# Patient Record
Sex: Female | Born: 1985 | Hispanic: Yes | Marital: Single | Smoking: Never smoker
Health system: Southern US, Community
[De-identification: ages and names within clinical notes are randomized; demographics above are authoritative.]

## PROBLEM LIST (undated history)

## (undated) DIAGNOSIS — N83209 Unspecified ovarian cyst, unspecified side: Secondary | ICD-10-CM

## (undated) DIAGNOSIS — J45909 Unspecified asthma, uncomplicated: Secondary | ICD-10-CM

## (undated) DIAGNOSIS — D649 Anemia, unspecified: Secondary | ICD-10-CM

## (undated) DIAGNOSIS — T7840XA Allergy, unspecified, initial encounter: Secondary | ICD-10-CM

## (undated) HISTORY — PX: TUBAL LIGATION: SHX77

## (undated) HISTORY — DX: Allergy, unspecified, initial encounter: T78.40XA

## (undated) HISTORY — PX: SHOULDER SURGERY: SHX246

---

## 2016-05-07 ENCOUNTER — Encounter (HOSPITAL_COMMUNITY): Payer: Self-pay

## 2016-05-07 ENCOUNTER — Emergency Department (HOSPITAL_COMMUNITY)
Admission: EM | Admit: 2016-05-07 | Discharge: 2016-05-08 | Disposition: A | Discharge status: Home / Self Care | Payer: Medicaid - Out of State | Attending: Emergency Medicine | Admitting: Emergency Medicine

## 2016-05-07 DIAGNOSIS — R200 Anesthesia of skin: Secondary | ICD-10-CM | POA: Insufficient documentation

## 2016-05-07 DIAGNOSIS — I639 Cerebral infarction, unspecified: Secondary | ICD-10-CM | POA: Diagnosis not present

## 2016-05-07 DIAGNOSIS — R42 Dizziness and giddiness: Secondary | ICD-10-CM | POA: Diagnosis present

## 2016-05-07 DIAGNOSIS — R202 Paresthesia of skin: Secondary | ICD-10-CM

## 2016-05-07 LAB — CBC
HEMATOCRIT: 35.8 % — AB (ref 36.0–46.0)
HEMOGLOBIN: 12.5 g/dL (ref 12.0–15.0)
MCH: 29.8 pg (ref 26.0–34.0)
MCHC: 34.9 g/dL (ref 30.0–36.0)
MCV: 85.2 fL (ref 78.0–100.0)
Platelets: 175 10*3/uL (ref 150–400)
RBC: 4.2 MIL/uL (ref 3.87–5.11)
RDW: 12.9 % (ref 11.5–15.5)
WBC: 12 10*3/uL — ABNORMAL HIGH (ref 4.0–10.5)

## 2016-05-07 LAB — URINE MICROSCOPIC-ADD ON: RBC / HPF: NONE SEEN RBC/hpf (ref 0–5)

## 2016-05-07 LAB — URINALYSIS, ROUTINE W REFLEX MICROSCOPIC
Bilirubin Urine: NEGATIVE
Glucose, UA: NEGATIVE mg/dL
HGB URINE DIPSTICK: NEGATIVE
Ketones, ur: NEGATIVE mg/dL
NITRITE: NEGATIVE
PH: 6 (ref 5.0–8.0)
Protein, ur: NEGATIVE mg/dL
SPECIFIC GRAVITY, URINE: 1.013 (ref 1.005–1.030)

## 2016-05-07 LAB — COMPREHENSIVE METABOLIC PANEL
ALBUMIN: 4.6 g/dL (ref 3.5–5.0)
ALK PHOS: 39 U/L (ref 38–126)
ALT: 18 U/L (ref 14–54)
ANION GAP: 8 (ref 5–15)
AST: 22 U/L (ref 15–41)
BUN: 18 mg/dL (ref 6–20)
CALCIUM: 9.2 mg/dL (ref 8.9–10.3)
CO2: 26 mmol/L (ref 22–32)
Chloride: 104 mmol/L (ref 101–111)
Creatinine, Ser: 0.57 mg/dL (ref 0.44–1.00)
GFR calc non Af Amer: >60 mL/min (ref >60)
GLUCOSE: 99 mg/dL (ref 65–99)
POTASSIUM: 3.6 mmol/L (ref 3.5–5.1)
SODIUM: 138 mmol/L (ref 135–145)
Total Bilirubin: 0.4 mg/dL (ref 0.3–1.2)
Total Protein: 7.5 g/dL (ref 6.5–8.1)

## 2016-05-07 LAB — POC URINE PREG, ED: PREG TEST UR: NEGATIVE

## 2016-05-07 LAB — LIPASE, BLOOD: LIPASE: 23 U/L (ref 11–51)

## 2016-05-07 NOTE — ED Triage Notes (Signed)
Pt states that she has been feeling dizzy all day today. Pt also endorsing 2 episodes of emesis and L sided facial numbness. No LOC. No facial droop or other stroke symptoms noted in triage. Ambulatory. A&Ox4.

## 2016-05-07 NOTE — ED Provider Notes (Signed)
[facility] DEPT Provider Note   CSN: [id number] Arrival date & time: 05/07/16  2015   By signing my name below, I, Macon Large, attest that this documentation has been prepared under the direction and in the presence of Rolland Porter, MD. Electronically Signed: Macon Large, ED Scribe. 05/07/16. 12:57 AM.  Time seen 23:54 PM  History   Chief Complaint Chief Complaint  Patient presents with  . Dizziness  . Emesis   The history is provided by the patient. No language interpreter was used.     HPI Comments: Shannon Tucker is a 30 y.o. female who presents to the Emergency Department complaining of moderate, constant dizziness onset today.She denies a feeling of spinning and states she just feels weak like she might pass out. She reports associated generalized weakness. Pt also reports numbness in left hand and facial numbness onset around 4 PM, but notes it lasted for about three hours. She notes having a similar episode of numbness 2-3 months ago but did not f/u with PCP. She states it is happening about once a month. She states it can last all day. She also notes she has vomited twice a day and feels nauseated, she denies any diarrhea. Pt also notes having HA that radiate towards the back of her head and is gradually worsened with movement of her head to the left or right and is not painful when she is looking straight ahead. She denies neck pain. Pt also notes blurred vision. Pt reports no modifying factors noted. Pt states she is not currently on birth control or hormone replacement. She also denies Fhx of strokes. Per pt, she notes having a  PCP in [place], but has not gotten one here due to her recent move to [place] two weeks ago. She is not a current smoker. She does not consume acholic beverages.    She denies weakness, trouble speaking and trouble thinking or saying what she wants to say. No additional complaints at this time.   PCP none  History reviewed. No pertinent past medical  history.  There are no active problems to display for this patient.   History reviewed. No pertinent surgical history.  OB History    No data available       Home Medications    Prior to Admission medications   Medication Sig Start Date End Date Taking? Authorizing Provider  Fructose-Dextrose-Phosphor Acd (NAUSEA CONTROL PO) Take 20 mLs by mouth every 8 (eight) hours as needed (nausea).   Yes Historical Provider, MD    Family History History reviewed. No pertinent family history.  Social History Social History  Substance Use Topics  . Smoking status: Never Smoker  . Smokeless tobacco: Never Used  . Alcohol use Not on file  unemployed Moved here from [place] 2 weeks ago   Allergies   Patient has no known allergies.   Review of Systems Review of Systems  Eyes: Positive for visual disturbance (blurred vision).  Gastrointestinal: Positive for diarrhea and vomiting.  Musculoskeletal: Negative for neck pain.  Neurological: Positive for dizziness, numbness (facial and left hand) and headaches.  All other systems reviewed and are negative.    Physical Exam Updated Vital Signs BP 108/74 (BP Location: Left Arm)   Pulse 70   Temp 97.9 F (36.6 C) (Oral)   Resp 18   Wt 127 lb (57.6 kg)   SpO2 100%   Vital signs normal    Physical Exam  Constitutional: She is oriented to person, place, and time. She appears  well-developed and well-nourished.  Non-toxic appearance. She does not appear ill. No distress.  HENT:  Head: Normocephalic and atraumatic.  Right Ear: External ear normal.  Left Ear: External ear normal.  Nose: Nose normal. No mucosal edema or rhinorrhea.  Mouth/Throat: Oropharynx is clear and moist and mucous membranes are normal. No dental abscesses or uvula swelling.  Eyes: Conjunctivae and EOM are normal. Pupils are equal, round, and reactive to light.  Neck: Normal range of motion and full passive range of motion without pain. Neck supple.  Cardiovascular:  Normal rate, regular rhythm and normal heart sounds.  Exam reveals no gallop and no friction rub.   No murmur heard. Pulmonary/Chest: Effort normal and breath sounds normal. No respiratory distress. She has no wheezes. She has no rhonchi. She has no rales. She exhibits no tenderness and no crepitus.  Abdominal: Soft. Normal appearance and bowel sounds are normal. She exhibits no distension. There is no tenderness. There is no rebound and no guarding.  Musculoskeletal: Normal range of motion. She exhibits no edema or tenderness.  Moves all extremities well.   Neurological: She is alert and oriented to person, place, and time. She has normal strength. No cranial nerve deficit.  Grips are equal. No pronator drift. No lower extremity weakness.   Skin: Skin is warm, dry and intact. No rash noted. No erythema. No pallor.  Psychiatric: She has a normal mood and affect. Her speech is normal and behavior is normal. Her mood appears not anxious.  Nursing note and vitals reviewed.    ED Treatments / Results  Labs (all labs ordered are listed, but only abnormal results are displayed) Results for orders placed or performed during the hospital encounter of 05/07/16  Lipase, blood  Result Value Ref Range   Lipase 23 11 - 51 U/L  Comprehensive metabolic panel  Result Value Ref Range   Sodium 138 135 - 145 mmol/L   Potassium 3.6 3.5 - 5.1 mmol/L   Chloride 104 101 - 111 mmol/L   CO2 26 22 - 32 mmol/L   Glucose, Bld 99 65 - 99 mg/dL   BUN 18 6 - 20 mg/dL   Creatinine, Ser 0.57 0.44 - 1.00 mg/dL   Calcium 9.2 8.9 - 10.3 mg/dL   Total Protein 7.5 6.5 - 8.1 g/dL   Albumin 4.6 3.5 - 5.0 g/dL   AST 22 15 - 41 U/L   ALT 18 14 - 54 U/L   Alkaline Phosphatase 39 38 - 126 U/L   Total Bilirubin 0.4 0.3 - 1.2 mg/dL   GFR calc non Af Amer >60 >60 mL/min   GFR calc Af Amer >60 >60 mL/min   Anion gap 8 5 - 15  CBC  Result Value Ref Range   WBC 12.0 (H) 4.0 - 10.5 K/uL   RBC 4.20 3.87 - 5.11 MIL/uL    Hemoglobin 12.5 12.0 - 15.0 g/dL   HCT 35.8 (L) 36.0 - 46.0 %   MCV 85.2 78.0 - 100.0 fL   MCH 29.8 26.0 - 34.0 pg   MCHC 34.9 30.0 - 36.0 g/dL   RDW 12.9 11.5 - 15.5 %   Platelets 175 150 - 400 K/uL  Urinalysis, Routine w reflex microscopic  Result Value Ref Range   Color, Urine YELLOW YELLOW   APPearance CLOUDY (A) CLEAR   Specific Gravity, Urine 1.013 1.005 - 1.030   pH 6.0 5.0 - 8.0   Glucose, UA NEGATIVE NEGATIVE mg/dL   Hgb urine dipstick NEGATIVE NEGATIVE  Bilirubin Urine NEGATIVE NEGATIVE   Ketones, ur NEGATIVE NEGATIVE mg/dL   Protein, ur NEGATIVE NEGATIVE mg/dL   Nitrite NEGATIVE NEGATIVE   Leukocytes, UA SMALL (A) NEGATIVE  Urine microscopic-add on  Result Value Ref Range   Squamous Epithelial / LPF 0-5 (A) NONE SEEN   WBC, UA 0-5 0 - 5 WBC/hpf   RBC / HPF NONE SEEN 0 - 5 RBC/hpf   Bacteria, UA RARE (A) NONE SEEN  POC urine preg, ED  Result Value Ref Range   Preg Test, Ur NEGATIVE NEGATIVE   Laboratory interpretation all normal except leukocytosis    EKG  EKG Interpretation None       Radiology Mr [facility] Wo Contrast  Mr Brain Wo Contrast  Result Date: 05/08/2016 CLINICAL DATA:  Dizziness.  Left-sided facial numbness. EXAM: MRI HEAD WITHOUT CONTRAST MRA HEAD WITHOUT CONTRAST TECHNIQUE: Multiplanar, multiecho pulse sequences of the brain and surrounding structures were obtained without intravenous contrast. Angiographic images of the head were obtained using MRA technique without contrast. COMPARISON:  None. FINDINGS: MRI HEAD FINDINGS Brain: No acute infarct or intraparenchymal hemorrhage. The midline structures are normal. No focal parenchymal signal abnormality. No mass lesion or midline shift. No hydrocephalus or extra-axial fluid collection. Vascular: Major intracranial arterial and venous sinus flow voids are preserved. No evidence of chronic microhemorrhage or amyloid angiopathy. Skull and upper cervical spine: The visualized skull base, calvarium,  upper cervical spine and extracranial soft tissues are normal. Sinuses/Orbits: No fluid levels or advanced mucosal thickening. No mastoid effusion. Normal orbits. MRA HEAD FINDINGS Intracranial internal carotid arteries: Normal. Anterior cerebral arteries: Normal. Middle cerebral arteries: Normal. Posterior communicating arteries: Present on the right. Absent on the left. Posterior cerebral arteries: Normal. Basilar artery: Normal. Vertebral arteries: Left dominant. Normal. Superior cerebellar arteries: Normal. Anterior inferior cerebellar arteries: Normal on the right. Not clearly seen on the left, which is not uncommon. Posterior inferior cerebellar arteries: Normal. IMPRESSION: 1. Normal MRI of the brain. 2. Normal MRA of the circle of Willis and intracranial arteries. Electronically Signed   By: Ulyses Jarred M.D.   On: 05/08/2016 04:01    Procedures Procedures (including critical care time)  Medications Ordered in ED Medications  LORazepam (ATIVAN) tablet 1 mg (1 mg Oral Given 05/08/16 0053)     Initial Impression / Assessment and Plan / ED Course  I have reviewed the triage vital signs and the nursing notes.  Pertinent labs & imaging results that were available during my care of the patient were reviewed by me and considered in my medical decision making (see chart for details).  Clinical Course    DIAGNOSTIC STUDIES: Oxygen Saturation is 100% on RA, normal by my interpretation.    COORDINATION OF CARE: 12:00 AM Discussed treatment plan with pt at bedside which includes labs, head MRA and pt agreed to plan.She is uncertain if she will have problems going into the MRI scanner so Ativan was ordered.  5 AM patient was given her MRI results. It is not clear at this point what was the cause of the numbness in her hand however it does not appear to be neurological. She was advised to take a baby aspirin a day and to get a primary care physician.    Final Clinical Impressions(s) / ED  Diagnoses   Final diagnoses:  CVA (cerebral vascular accident) [facility])    New Prescriptions ASA 81 mg once a day  I personally performed the services described in this documentation, which was scribed in my  presence. The recorded information has been reviewed and considered.  Rolland Porter, MD, Barbette Or, MD 05/08/16 [phone]

## 2016-05-08 ENCOUNTER — Ambulatory Visit (HOSPITAL_COMMUNITY)
Admit: 2016-05-08 | Discharge: 2016-05-08 | Disposition: A | Discharge status: Home / Self Care | Payer: Medicaid - Out of State | Attending: Emergency Medicine | Admitting: Emergency Medicine

## 2016-05-08 MED ORDER — LORAZEPAM 1 MG PO TABS
1.0000 mg | ORAL_TABLET | Freq: Once | ORAL | Status: AC
  Administered 2016-05-08: 1 mg via ORAL
  Filled 2016-05-08: qty 1

## 2016-05-08 NOTE — Discharge Instructions (Signed)
Your tests tonight are normal, no stroke!!! You can take a baby aspirin 81 mg once a day until you can get a local primary care doctor. Recheck if the numbness happens again.

## 2016-05-08 NOTE — ED Notes (Signed)
Bed: WA16 Expected date:  Expected time:  Means of arrival:  Comments: 

## 2016-05-08 NOTE — ED Notes (Signed)
Patient transported to MRI at [facility] per md order.

## 2016-05-15 ENCOUNTER — Ambulatory Visit (HOSPITAL_COMMUNITY): Payer: Self-pay

## 2016-12-17 ENCOUNTER — Inpatient Hospital Stay (HOSPITAL_COMMUNITY)
Admission: EM | Admit: 2016-12-17 | Discharge: 2016-12-17 | Disposition: A | Discharge status: Home / Self Care | Payer: Self-pay | Attending: Obstetrics and Gynecology | Admitting: Obstetrics and Gynecology

## 2016-12-17 ENCOUNTER — Emergency Department (HOSPITAL_COMMUNITY): Payer: Self-pay

## 2016-12-17 ENCOUNTER — Encounter (HOSPITAL_COMMUNITY): Payer: Self-pay | Admitting: Nurse Practitioner

## 2016-12-17 DIAGNOSIS — R1030 Lower abdominal pain, unspecified: Secondary | ICD-10-CM

## 2016-12-17 DIAGNOSIS — N83209 Unspecified ovarian cyst, unspecified side: Secondary | ICD-10-CM

## 2016-12-17 DIAGNOSIS — R109 Unspecified abdominal pain: Secondary | ICD-10-CM | POA: Insufficient documentation

## 2016-12-17 DIAGNOSIS — N83201 Unspecified ovarian cyst, right side: Secondary | ICD-10-CM | POA: Insufficient documentation

## 2016-12-17 DIAGNOSIS — Z9851 Tubal ligation status: Secondary | ICD-10-CM | POA: Insufficient documentation

## 2016-12-17 HISTORY — DX: Unspecified ovarian cyst, unspecified side: N83.209

## 2016-12-17 HISTORY — DX: Unspecified asthma, uncomplicated: J45.909

## 2016-12-17 LAB — CBC
HCT: 30.3 % — ABNORMAL LOW (ref 36.0–46.0)
HEMATOCRIT: 25.1 % — AB (ref 36.0–46.0)
Hemoglobin: 10.3 g/dL — ABNORMAL LOW (ref 12.0–15.0)
Hemoglobin: 8.6 g/dL — ABNORMAL LOW (ref 12.0–15.0)
MCH: 30.1 pg (ref 26.0–34.0)
MCH: 30.6 pg (ref 26.0–34.0)
MCHC: 34 g/dL (ref 30.0–36.0)
MCHC: 34.3 g/dL (ref 30.0–36.0)
MCV: 88.6 fL (ref 78.0–100.0)
MCV: 89.3 fL (ref 78.0–100.0)
PLATELETS: 248 10*3/uL (ref 150–400)
Platelets: 202 10*3/uL (ref 150–400)
RBC: 3.42 MIL/uL — AB (ref 3.87–5.11)
RBC: 2.81 MIL/uL — ABNORMAL LOW (ref 3.87–5.11)
RDW: 13.4 % (ref 11.5–15.5)
RDW: 13.5 % (ref 11.5–15.5)
WBC: 16.2 10*3/uL — ABNORMAL HIGH (ref 4.0–10.5)
WBC: 13.8 10*3/uL — ABNORMAL HIGH (ref 4.0–10.5)

## 2016-12-17 LAB — COMPREHENSIVE METABOLIC PANEL
ALBUMIN: 3.7 g/dL (ref 3.5–5.0)
ALK PHOS: 49 U/L (ref 38–126)
ALT: 15 U/L (ref 14–54)
AST: 24 U/L (ref 15–41)
Anion gap: 10 (ref 5–15)
BUN: 12 mg/dL (ref 6–20)
CALCIUM: 8.5 mg/dL — AB (ref 8.9–10.3)
CHLORIDE: 105 mmol/L (ref 101–111)
CO2: 22 mmol/L (ref 22–32)
CREATININE: 0.59 mg/dL (ref 0.44–1.00)
GFR calc non Af Amer: >60 mL/min (ref >60)
GLUCOSE: 129 mg/dL — AB (ref 65–99)
Potassium: 3.7 mmol/L (ref 3.5–5.1)
SODIUM: 137 mmol/L (ref 135–145)
Total Bilirubin: 0.2 mg/dL — ABNORMAL LOW (ref 0.3–1.2)
Total Protein: 6.9 g/dL (ref 6.5–8.1)

## 2016-12-17 LAB — TYPE AND SCREEN
ABO/RH(D): A POS
Antibody Screen: NEGATIVE

## 2016-12-17 LAB — URINALYSIS, ROUTINE W REFLEX MICROSCOPIC
BILIRUBIN URINE: NEGATIVE
GLUCOSE, UA: NEGATIVE mg/dL
HGB URINE DIPSTICK: NEGATIVE
Ketones, ur: NEGATIVE mg/dL
Leukocytes, UA: NEGATIVE
Nitrite: NEGATIVE
PROTEIN: NEGATIVE mg/dL
Specific Gravity, Urine: 1.01 (ref 1.005–1.030)
pH: 7 (ref 5.0–8.0)

## 2016-12-17 LAB — I-STAT BETA HCG BLOOD, ED (MC, WL, AP ONLY)

## 2016-12-17 LAB — LIPASE, BLOOD: LIPASE: 27 U/L (ref 11–51)

## 2016-12-17 LAB — HEMOGLOBIN AND HEMATOCRIT, BLOOD
HEMATOCRIT: 25.3 % — AB (ref 36.0–46.0)
HEMOGLOBIN: 8.8 g/dL — AB (ref 12.0–15.0)

## 2016-12-17 MED ORDER — LACTATED RINGERS IV SOLN
INTRAVENOUS | Status: DC
  Administered 2016-12-17: 08:00:00 via INTRAVENOUS

## 2016-12-17 MED ORDER — KETOROLAC TROMETHAMINE 30 MG/ML IJ SOLN
30.0000 mg | Freq: Once | INTRAMUSCULAR | Status: AC
  Administered 2016-12-17: 30 mg via INTRAVENOUS
  Filled 2016-12-17: qty 1

## 2016-12-17 MED ORDER — ONDANSETRON HCL 4 MG/2ML IJ SOLN: 4.0000 mg | Freq: Once | INTRAMUSCULAR | Status: DC

## 2016-12-17 MED ORDER — ONDANSETRON HCL 4 MG/2ML IJ SOLN
4.0000 mg | Freq: Once | INTRAMUSCULAR | Status: AC | PRN
  Administered 2016-12-17: 4 mg via INTRAVENOUS
  Filled 2016-12-17: qty 2

## 2016-12-17 MED ORDER — MORPHINE SULFATE (PF) 4 MG/ML IV SOLN
4.0000 mg | Freq: Once | INTRAVENOUS | Status: AC
  Administered 2016-12-17: 4 mg via INTRAVENOUS
  Filled 2016-12-17: qty 1

## 2016-12-17 MED ORDER — IBUPROFEN 600 MG PO TABS: 600.0000 mg | ORAL_TABLET | Freq: Four times a day (QID) | ORAL | 0 refills | Status: DC | PRN

## 2016-12-17 MED ORDER — IOPAMIDOL (ISOVUE-300) INJECTION 61%
INTRAVENOUS | Status: AC
  Filled 2016-12-17: qty 100

## 2016-12-17 MED ORDER — SODIUM CHLORIDE 0.9 % IV BOLUS (SEPSIS)
1000.0000 mL | Freq: Once | INTRAVENOUS | Status: AC
  Administered 2016-12-17: 1000 mL via INTRAVENOUS

## 2016-12-17 MED ORDER — OXYCODONE-ACETAMINOPHEN 5-325 MG PO TABS: 1.0000 | ORAL_TABLET | ORAL | 0 refills | Status: DC | PRN

## 2016-12-17 MED ORDER — IOPAMIDOL (ISOVUE-300) INJECTION 61%
100.0000 mL | Freq: Once | INTRAVENOUS | Status: AC | PRN
  Administered 2016-12-17: 100 mL via INTRAVENOUS

## 2016-12-17 MED ORDER — OXYCODONE-ACETAMINOPHEN 2.5-325 MG PO TABS: 1.0000 | ORAL_TABLET | ORAL | 0 refills | Status: DC | PRN

## 2016-12-17 NOTE — ED Notes (Signed)
[facility] arrived to transport patient  

## 2016-12-17 NOTE — Discharge Instructions (Signed)
Return if pain worsens, or if you have increasing dizziness when standing. Drink at least 8 8-oz glasses of water every day. Eat healthy meals that have iron rich foods. Take a multivitamin daily. Expect the clinic to call you for a follow up appointment for a recheck.  And likely an ultrasound will be needed in 4-6 weeks to recheck the right ovarian cyst identified on ultrasound.

## 2016-12-17 NOTE — ED Notes (Signed)
Report given to [facility] via telephone

## 2016-12-17 NOTE — ED Triage Notes (Signed)
Patient presents with RUQ severe abdominal pain causing her to bend over. States it started earlier yesterday evening. Pain comes in waves. She states she has been throwing up for the past few hours and is now weak and pain is becoming more severe. Denies dizziness, lightheadedness, headaches. Continues to cry out when pain hits, Unable to palpate abdomen due to severe pain.

## 2016-12-17 NOTE — MAU Note (Signed)
Brought in by EMS from [facility] ED; Pain is 9 out of 10 and last pain med was around 0430; IV in R hand #22 present;

## 2016-12-17 NOTE — ED Provider Notes (Signed)
[facility] DEPT Provider Note   CSN: [id number] Arrival date & time: 12/17/16  8366     History   Chief Complaint Chief Complaint  Patient presents with  . Abdominal Pain  . Nausea    HPI Shannon Tucker is a 31 y.o. female.  31 yo F with a chief complaints of abdominal pain. Going on for about a week. Patient states it's crampy diffuse about her abdomen. Gets worse throughout the day and then usually resolves overnight. Worse to the lower aspects of the abdomen. Having some constipation and some nausea and vomiting with this. Denies fevers. She states she had an issue similar to this after her second C-section which was about a year ago.   The history is provided by the patient and a relative.  Abdominal Pain   This is a new problem. The problem occurs constantly. The problem has been rapidly worsening. The pain is located in the generalized abdominal region. The quality of the pain is aching and cramping. The pain is at a severity of 10/10. The pain is severe. Associated symptoms include nausea, vomiting and constipation. Pertinent negatives include fever, dysuria, headaches, arthralgias and myalgias. Nothing aggravates the symptoms. Nothing relieves the symptoms.    Past Medical History:  Diagnosis Date  . Asthma   . Ovarian cyst     There are no active problems to display for this patient.   Past Surgical History:  Procedure Laterality Date  . CESAREAN SECTION    . SHOULDER SURGERY      OB History    Gravida Para Term Preterm AB Living   3 2 2   1 2    SAB TAB Ectopic Multiple Live Births   1       2       Home Medications    Prior to Admission medications   Medication Sig Start Date End Date Taking? Authorizing Provider  ibuprofen (ADVIL,MOTRIN) 600 MG tablet Take 1 tablet (600 mg total) by mouth every 6 (six) hours as needed. 12/17/16   Burleson, Rona Ravens, NP  oxyCODONE-acetaminophen (PERCOCET/ROXICET) 5-325 MG tablet Take 1 tablet by mouth every 4 (four)  hours as needed for severe pain. 12/17/16   Virginia Rochester, NP    Family History No family history on file.  Social History Social History  Substance Use Topics  . Smoking status: Never Smoker  . Smokeless tobacco: Never Used  . Alcohol use No     Allergies   Patient has no known allergies.   Review of Systems Review of Systems  Constitutional: Negative for chills and fever.  HENT: Negative for congestion and rhinorrhea.   Eyes: Negative for redness and visual disturbance.  Respiratory: Negative for shortness of breath and wheezing.   Cardiovascular: Negative for chest pain and palpitations.  Gastrointestinal: Positive for abdominal distention, abdominal pain, constipation, nausea and vomiting.  Genitourinary: Negative for dysuria and urgency.  Musculoskeletal: Negative for arthralgias and myalgias.  Skin: Negative for pallor and wound.  Neurological: Negative for dizziness and headaches.     Physical Exam Updated Vital Signs BP (!) 102/59   Pulse 81   Temp 98.3 F (36.8 C) (Oral)   Resp 18   Ht 5\' 5"  (1.651 m)   Wt 61.2 kg (135 lb)   SpO2 100%   BMI 22.47 kg/m   Physical Exam  Constitutional: She is oriented to person, place, and time. She appears well-developed and well-nourished. No distress.  HENT:  Head: Normocephalic and atraumatic.  Eyes:  EOM are normal. Pupils are equal, round, and reactive to light.  Neck: Normal range of motion. Neck supple.  Cardiovascular: Normal rate and regular rhythm.  Exam reveals no gallop and no friction rub.   No murmur heard. Pulmonary/Chest: Effort normal. She has no wheezes. She has no rales.  Abdominal: Soft. She exhibits distension (subjective). She exhibits no mass. There is tenderness (diffuse lower). There is no guarding.  Musculoskeletal: She exhibits no edema or tenderness.  Neurological: She is alert and oriented to person, place, and time.  Skin: Skin is warm and dry. She is not diaphoretic.  Psychiatric:  She has a normal mood and affect. Her behavior is normal.  Nursing note and vitals reviewed.    ED Treatments / Results  Labs (all labs ordered are listed, but only abnormal results are displayed) Labs Reviewed  COMPREHENSIVE METABOLIC PANEL - Abnormal; Notable for the following:       Result Value   Glucose, Bld 129 (*)    Calcium 8.5 (*)    Total Bilirubin 0.2 (*)    All other components within normal limits  CBC - Abnormal; Notable for the following:    WBC 16.2 (*)    RBC 3.42 (*)    Hemoglobin 10.3 (*)    HCT 30.3 (*)    All other components within normal limits  HEMOGLOBIN AND HEMATOCRIT, BLOOD - Abnormal; Notable for the following:    Hemoglobin 8.8 (*)    HCT 25.3 (*)    All other components within normal limits  CBC - Abnormal; Notable for the following:    WBC 13.8 (*)    RBC 2.81 (*)    Hemoglobin 8.6 (*)    HCT 25.1 (*)    All other components within normal limits  LIPASE, BLOOD  URINALYSIS, ROUTINE W REFLEX MICROSCOPIC  I-STAT BETA HCG BLOOD, ED ([facility], [facility], [facility] ONLY)  TYPE AND SCREEN  ABO/RH    EKG  EKG Interpretation None       Radiology US Transvaginal Non-ob  Result Date: 12/17/2016 CLINICAL DATA:  Pelvic pain for 6 days. Free fluid in the pelvis identified on CT. Previous bilateral tubal ligation. Gravida 3 para 2 with negative quantitative beta HCG. LMP was possibly 11/27/2016. EXAM: TRANSABDOMINAL AND TRANSVAGINAL ULTRASOUND OF PELVIS DOPPLER ULTRASOUND OF OVARIES TECHNIQUE: Both transabdominal and transvaginal ultrasound examinations of the pelvis were performed. Transabdominal technique was performed for global imaging of the pelvis including uterus, ovaries, adnexal regions, and pelvic cul-de-sac. It was necessary to proceed with endovaginal exam following the transabdominal exam to visualize the adnexal regions and uterus. Color and duplex Doppler ultrasound was utilized to evaluate blood flow to the ovaries. COMPARISON:  CT of the abdomen and pelvis  on 12/17/2016 FINDINGS: Uterus Measurements: At least 9.1 x 4.9 x 5.9 cm. No fibroids or other mass visualized. Endometrium Thickness: 4.2 mm.  No focal abnormality visualized. Right ovary Measurements: Right adnexal region measures 8.1 x 6.4 x 5.8 cm. Within the right adnexa, there are discrete cystic components. The largest of these is 4.9 cm. 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 A smaller cystic component is 2.9 cm. Additional solid component is also noted. Left ovary Measurements: 3.4 x 1.9 x 2.6 cm. Complex solid area around the left ovary is 7.1 x 5.0 x 6.5 cm and may represent collapse. Pulsed Doppler evaluation of both ovaries demonstrates normal low-resistance arterial and  venous waveforms. Other findings A large amount of free pelvic fluid is present. Study quality is degraded by patient's pain. IMPRESSION: 1. Complex appearance of the right adnexal region. Cystic components are consistent with a dermoid cyst. However, other solid components are present, likely indicating solid component of the mass as well as clot. 2. Left ovary is also surrounded by colonic and hemorrhage. 3. Normal appearance of the uterus. 4. Findings favor rupture and hemorrhage of a dermoid cyst. However, given the complexity of the findings and multiple solid components, malignancy has not been excluded. Electronically Signed   By: Nolon Nations M.D.   On: 12/17/2016 07:25   US Pelvis Complete  Result Date: 12/17/2016 CLINICAL DATA:  Pelvic pain for 6 days. Free fluid in the pelvis identified on CT. Previous bilateral tubal ligation. Gravida 3 para 2 with negative quantitative beta HCG. LMP was possibly 11/27/2016. EXAM: TRANSABDOMINAL AND TRANSVAGINAL ULTRASOUND OF PELVIS DOPPLER ULTRASOUND OF OVARIES TECHNIQUE: Both transabdominal and transvaginal ultrasound examinations of the pelvis were performed.  Transabdominal technique was performed for global imaging of the pelvis including uterus, ovaries, adnexal regions, and pelvic cul-de-sac. It was necessary to proceed with endovaginal exam following the transabdominal exam to visualize the adnexal regions and uterus. Color and duplex Doppler ultrasound was utilized to evaluate blood flow to the ovaries. COMPARISON:  CT of the abdomen and pelvis on 12/17/2016 FINDINGS: Uterus Measurements: At least 9.1 x 4.9 x 5.9 cm. No fibroids or other mass visualized. Endometrium Thickness: 4.2 mm.  No focal abnormality visualized. Right ovary Measurements: Right adnexal region measures 8.1 x 6.4 x 5.8 cm. Within the right adnexa, there are discrete cystic components. The largest of these is 4.9 cm. 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 A smaller cystic component is 2.9 cm. Additional solid component is also noted. Left ovary Measurements: 3.4 x 1.9 x 2.6 cm. Complex solid area around the left ovary is 7.1 x 5.0 x 6.5 cm and may represent collapse. Pulsed Doppler evaluation of both ovaries demonstrates normal low-resistance arterial and venous waveforms. Other findings A large amount of free pelvic fluid is present. Study quality is degraded by patient's pain. IMPRESSION: 1. Complex appearance of the right adnexal region. Cystic components are consistent with a dermoid cyst. However, other solid components are present, likely indicating solid component of the mass as well as clot. 2. Left ovary is also surrounded by colonic and hemorrhage. 3. Normal appearance of the uterus. 4. Findings favor rupture and hemorrhage of a dermoid cyst. However, given the complexity of the findings and multiple solid components, malignancy has not been excluded. Electronically Signed   By: Nolon Nations M.D.   On: 12/17/2016 07:25   Ct Abdomen Pelvis W Contrast  Result Date:  12/17/2016 CLINICAL DATA:  Acute onset of right upper quadrant abdominal pain and vomiting. Initial encounter. EXAM: CT ABDOMEN AND PELVIS WITH CONTRAST TECHNIQUE: Multidetector CT imaging of the abdomen and pelvis was performed using the standard protocol following bolus administration of intravenous contrast. CONTRAST:  110mL ISOVUE-300 IOPAMIDOL (ISOVUE-300) INJECTION 61% COMPARISON:  None. FINDINGS: Lower chest: The visualized lung bases are grossly clear. The visualized portions of the mediastinum are unremarkable. Hepatobiliary: The liver is unremarkable in appearance. The gallbladder is unremarkable in appearance. The common bile duct remains normal in caliber. Pancreas: The pancreas is within normal limits. Spleen: The spleen is unremarkable in appearance. Adrenals/Urinary Tract: The  adrenal glands are unremarkable in appearance. A left renal cyst is noted. The kidneys are otherwise unremarkable. There is no evidence of hydronephrosis. No renal or ureteral stones are identified. Stomach/Bowel: The appendix is normal in caliber, without evidence of appendicitis. The colon is unremarkable in appearance. The small bowel is grossly unremarkable, though blood is seen tracking about multiple small bowel loops. The stomach is unremarkable in appearance. Vascular/Lymphatic: The abdominal aorta is unremarkable in appearance. The inferior vena cava is grossly unremarkable. No retroperitoneal lymphadenopathy is seen. No pelvic sidewall lymphadenopathy is identified. Reproductive: The uterus is grossly unremarkable in appearance, though surrounded by blood. There appears to be a ruptured structure at the pelvic cul-de-sac, measuring approximately 6.8 cm, with a large amount of blood noted filling the pelvis, tracking superiorly along the paracolic gutters and surrounding the liver and spleen. Given the negative pregnancy test, this most likely reflects a ruptured hemorrhagic cyst, though the extent of blood is somewhat  unusual for a hemorrhagic cyst. Multiple cystic foci are noted at the right adnexa, measuring up to 5.3 cm in size, with a focus of fat and calcification, likely reflecting an underlying dermoid cyst. Other: No additional soft tissue abnormalities are seen. Musculoskeletal: No acute osseous abnormalities are identified. The visualized musculature is unremarkable in appearance. IMPRESSION: 1. Apparent ruptured structure noted at the pelvic cul-de-sac, measuring 6.8 cm, with a large amount of blood noted filling the pelvis, tracking superiorly along the paracolic gutters and surrounding the liver and spleen. Small amount of blood noted tracking about small bowel loops. Given the negative pregnancy test, this most likely reflects a ruptured hemorrhagic cyst, though the extent of blood is somewhat unusual for a hemorrhagic cyst. Pelvic ultrasound could be considered to better assess the ruptured structure in the pelvis, as deemed clinically appropriate. Would monitor the patient's hemodynamic state. 2. Multiple cystic foci at the right adnexa, measuring up to 5.3 cm in size, with a focus of fat calcification, likely reflecting an underlying dermoid cyst. This would also be better assessed on ultrasound. 3. Left renal cyst noted. Critical Value/emergent results were called by telephone at the time of interpretation on 12/17/2016 at 5:17 am to Dr. Deno Etienne, who verbally acknowledged these results. Electronically Signed   By: Garald Balding M.D.   On: 12/17/2016 05:22   [place] Art/ven Flow Abd Pelv Doppler  Result Date: 12/17/2016 CLINICAL DATA:  Pelvic pain for 6 days. Free fluid in the pelvis identified on CT. Previous bilateral tubal ligation. Gravida 3 para 2 with negative quantitative beta HCG. LMP was possibly 11/27/2016. EXAM: TRANSABDOMINAL AND TRANSVAGINAL ULTRASOUND OF PELVIS DOPPLER ULTRASOUND OF OVARIES TECHNIQUE: Both transabdominal and transvaginal ultrasound examinations of the pelvis were performed.  Transabdominal technique was performed for global imaging of the pelvis including uterus, ovaries, adnexal regions, and pelvic cul-de-sac. It was necessary to proceed with endovaginal exam following the transabdominal exam to visualize the adnexal regions and uterus. Color and duplex Doppler ultrasound was utilized to evaluate blood flow to the ovaries. COMPARISON:  CT of the abdomen and pelvis on 12/17/2016 FINDINGS: Uterus Measurements: At least 9.1 x 4.9 x 5.9 cm. No fibroids or other mass visualized. Endometrium Thickness: 4.2 mm.  No focal abnormality visualized. Right ovary Measurements: Right adnexal region measures 8.1 x 6.4 x 5.8 cm. Within the right adnexa, there are discrete cystic components. The largest of these is 4.9 cm. This cyst contains a hyperdense nodule measuring 2.6 x 2.0 cm, likely correlating with the fatty nodule seen on CT,  likely representing a Rokit[place]ky flutter. A smaller cystic component is 2.9 cm. Additional solid component is also noted. Left ovary Measurements: 3.4 x 1.9 x 2.6 cm. Complex solid area around the left ovary is 7.1 x 5.0 x 6.5 cm and may represent collapse. Pulsed Doppler evaluation of both ovaries demonstrates normal low-resistance arterial and venous waveforms. Other findings A large amount of free pelvic fluid is present. Study quality is degraded by patient's pain. IMPRESSION: 1. Complex appearance of the right adnexal region. Cystic components are consistent with a dermoid cyst. However, other solid components are present, likely indicating solid component of the mass as well as clot. 2. Left ovary is also surrounded by colonic and hemorrhage. 3. Normal appearance of the uterus. 4. Findings favor rupture and hemorrhage of a dermoid cyst. However, given the complexity of the findings and multiple solid components, malignancy has not been excluded. Electronically Signed   By: Nolon Nations M.D.   On: 12/17/2016 07:25    Procedures Procedures (including critical  care time)  Medications Ordered in ED Medications  ondansetron (ZOFRAN) injection 4 mg (4 mg Intravenous Given 12/17/16 0408)  sodium chloride 0.9 % bolus 1,000 mL (0 mLs Intravenous Stopped 12/17/16 0713)  morphine 4 MG/ML injection 4 mg (4 mg Intravenous Given 12/17/16 0431)  iopamidol (ISOVUE-300) 61 % injection 100 mL (100 mLs Intravenous Contrast Given 12/17/16 0452)  ketorolac (TORADOL) 30 MG/ML injection 30 mg (30 mg Intravenous Given 12/17/16 0831)     Initial Impression / Assessment and Plan / ED Course  I have reviewed the triage vital signs and the nursing notes.  Pertinent labs & imaging results that were available during my care of the patient were reviewed by me and considered in my medical decision making (see chart for details).     31 yo F with diffuse abdominal pain. Going on for past week.  Worst to lower abdomen on exam.  Will CT.   CT scan with large amount of blood in the pelvis. Radiology is unsure of the location of the bleed. He said based on the imaging he would suspect that it was a ruptured ectopic pregnancy. However the presence of test was negative. He thinks most likely at this point is a ruptured ovarian cyst. Recommended a possible pelvic ultrasound to further delineate.  Discussed with Dr. Ilda Basset, [place].  Feels most likely ruptured ovarian cyst, recommended repeat H&H if not significantly changed recommended pain control and follow up in two to three weeks.   H&H with drop in Hbg, transfer to womens.   CRITICAL CARE Performed by: Cecilio Asper   Total critical care time: 35 minutes  Critical care time was exclusive of separately billable procedures and treating other patients.  Critical care was necessary to treat or prevent imminent or life-threatening deterioration.  Critical care was time spent personally by me on the following activities: development of treatment plan with patient and/or surrogate as well as nursing, discussions with  consultants, evaluation of patient's response to treatment, examination of patient, obtaining history from patient or surrogate, ordering and performing treatments and interventions, ordering and review of laboratory studies, ordering and review of radiographic studies, pulse oximetry and re-evaluation of patient's condition.  The patients results and plan were reviewed and discussed.   Any x-rays performed were independently reviewed by myself.   Differential diagnosis were considered with the presenting HPI.  Medications  ondansetron (ZOFRAN) injection 4 mg (4 mg Intravenous Given 12/17/16 0408)  sodium chloride 0.9 % bolus 1,000  mL (0 mLs Intravenous Stopped 12/17/16 0713)  morphine 4 MG/ML injection 4 mg (4 mg Intravenous Given 12/17/16 0431)  iopamidol (ISOVUE-300) 61 % injection 100 mL (100 mLs Intravenous Contrast Given 12/17/16 0452)  ketorolac (TORADOL) 30 MG/ML injection 30 mg (30 mg Intravenous Given 12/17/16 0831)    Vitals:   12/17/16 0830 12/17/16 0845 12/17/16 0900 12/17/16 1019  BP: (!) 93/57 (!) 94/57 98/60 (!) 102/59  Pulse: 82 81 79 81  Resp:    18  Temp:      TempSrc:      SpO2:      Weight:      Height:        Final diagnoses:  Lower abdominal pain  Ruptured ovarian cyst    Admission/ observation were discussed with the admitting physician, patient and/or family and they are comfortable with the plan.     Final Clinical Impressions(s) / ED Diagnoses   Final diagnoses:  Lower abdominal pain  Ruptured ovarian cyst    New Prescriptions Discharge Medication List as of 12/17/2016 10:22 AM    START taking these medications   Details  ibuprofen (ADVIL,MOTRIN) 600 MG tablet Take 1 tablet (600 mg total) by mouth every 6 (six) hours as needed., Starting Sun 12/17/2016, Normal    oxycodone-acetaminophen (PERCOCET) 2.5-325 MG tablet Take 1 tablet by mouth every 4 (four) hours as needed for pain., Starting Sun 12/17/2016, [place], Linna Hoff, DO 12/18/16  1201

## 2016-12-17 NOTE — ED Notes (Signed)
[facility] was called and notified of pt's need of transportation to [facility].

## 2016-12-17 NOTE — ED Notes (Signed)
Ultrasound in process at bedside at this time. 

## 2016-12-17 NOTE — MAU Provider Note (Signed)
History     CSN: [id number]  Arrival date and time: 12/17/16 3007   First Provider Initiated Contact with Patient 12/17/16 0813      Chief Complaint  Patient presents with  . Abdominal Pain  . Nausea   HPI Shannon Tucker 31 y.o. Comes to MAU by Ava from [facility].  Having abdominal pain and has been diagnosed by CT with a hemorrhagic ovarian cyst and blood in the abdomen.  Pregnancy test is negative.  Monitoring hemoglobin and managing pain at this time.  Currently at [facility] she had Morphine for pain.   OB History    No data available      Past Medical History:  Diagnosis Date  . Asthma   . Ovarian cyst     Past Surgical History:  Procedure Laterality Date  . CESAREAN SECTION    . SHOULDER SURGERY      No family history on file.  Social History  Substance Use Topics  . Smoking status: Never Smoker  . Smokeless tobacco: Never Used  . Alcohol use No    Allergies: No Known Allergies  No prescriptions prior to admission.    Review of Systems  Constitutional: Negative for fever.  Gastrointestinal: Positive for abdominal pain.       Pain sometimes radiates to her shoulders bilaterally Unable to stand up straight due to the pain.  Genitourinary: Negative for vaginal bleeding.  Neurological: Negative for dizziness and light-headedness.   Physical Exam   Blood pressure 95/61, pulse 88, temperature 98.3 F (36.8 C), temperature source Oral, resp. rate 18, height 5\' 5"  (1.651 m), weight 135 lb (61.2 kg), SpO2 100 %.  Physical Exam  Nursing note and vitals reviewed. Constitutional: She is oriented to person, place, and time. She appears well-developed and well-nourished.  HENT:  Head: Normocephalic.  Eyes: EOM are normal.  Neck: Neck supple.  Cardiovascular: Normal rate.   Respiratory: Effort normal.  GI: Soft. There is tenderness.  Gentle abdominal exam done  Musculoskeletal: Normal range of motion.  Neurological: She is alert and oriented to person, place,  and time.  Skin: Skin is warm and dry.  Psychiatric: She has a normal mood and affect.    MAU Course  Procedures Results for orders placed or performed during the hospital encounter of 12/17/16 (from the past 24 hour(s))  Lipase, blood     Status: None   Collection Time: 12/17/16  3:55 AM  Result Value Ref Range   Lipase 27 11 - 51 U/L  Comprehensive metabolic panel     Status: Abnormal   Collection Time: 12/17/16  3:55 AM  Result Value Ref Range   Sodium 137 135 - 145 mmol/L   Potassium 3.7 3.5 - 5.1 mmol/L   Chloride 105 101 - 111 mmol/L   CO2 22 22 - 32 mmol/L   Glucose, Bld 129 (H) 65 - 99 mg/dL   BUN 12 6 - 20 mg/dL   Creatinine, Ser 0.59 0.44 - 1.00 mg/dL   Calcium 8.5 (L) 8.9 - 10.3 mg/dL   Total Protein 6.9 6.5 - 8.1 g/dL   Albumin 3.7 3.5 - 5.0 g/dL   AST 24 15 - 41 U/L   ALT 15 14 - 54 U/L   Alkaline Phosphatase 49 38 - 126 U/L   Total Bilirubin 0.2 (L) 0.3 - 1.2 mg/dL   GFR calc non Af Amer >60 >60 mL/min   GFR calc Af Amer >60 >60 mL/min   Anion gap 10 5 - 15  CBC     Status: Abnormal   Collection Time: 12/17/16  3:55 AM  Result Value Ref Range   WBC 16.2 (H) 4.0 - 10.5 K/uL   RBC 3.42 (L) 3.87 - 5.11 MIL/uL   Hemoglobin 10.3 (L) 12.0 - 15.0 g/dL   HCT 30.3 (L) 36.0 - 46.0 %   MCV 88.6 78.0 - 100.0 fL   MCH 30.1 26.0 - 34.0 pg   MCHC 34.0 30.0 - 36.0 g/dL   RDW 13.4 11.5 - 15.5 %   Platelets 248 150 - 400 K/uL  I-Stat beta hCG blood, ED     Status: None   Collection Time: 12/17/16  4:08 AM  Result Value Ref Range   I-stat hCG, quantitative <5.0 <5 mIU/mL   Comment 3          Hemoglobin and hematocrit, blood     Status: Abnormal   Collection Time: 12/17/16  5:48 AM  Result Value Ref Range   Hemoglobin 8.8 (L) 12.0 - 15.0 g/dL   HCT 25.3 (L) 36.0 - 46.0 %  Urinalysis, Routine w reflex microscopic     Status: None   Collection Time: 12/17/16  6:06 AM  Result Value Ref Range   Color, Urine YELLOW YELLOW   APPearance CLEAR CLEAR   Specific Gravity,  Urine 1.010 1.005 - 1.030   pH 7.0 5.0 - 8.0   Glucose, UA NEGATIVE NEGATIVE mg/dL   Hgb urine dipstick NEGATIVE NEGATIVE   Bilirubin Urine NEGATIVE NEGATIVE   Ketones, ur NEGATIVE NEGATIVE mg/dL   Protein, ur NEGATIVE NEGATIVE mg/dL   Nitrite NEGATIVE NEGATIVE   Leukocytes, UA NEGATIVE NEGATIVE  Type and screen     Status: None   Collection Time: 12/17/16  6:30 AM  Result Value Ref Range   ABO/RH(D) A POS    Antibody Screen NEG    Sample Expiration 12/20/2016   CBC     Status: Abnormal   Collection Time: 12/17/16  8:05 AM  Result Value Ref Range   WBC 13.8 (H) 4.0 - 10.5 K/uL   RBC 2.81 (L) 3.87 - 5.11 MIL/uL   Hemoglobin 8.6 (L) 12.0 - 15.0 g/dL   HCT 25.1 (L) 36.0 - 46.0 %   MCV 89.3 78.0 - 100.0 fL   MCH 30.6 26.0 - 34.0 pg   MCHC 34.3 30.0 - 36.0 g/dL   RDW 13.5 11.5 - 15.5 %   Platelets 202 150 - 400 K/uL   CLINICAL DATA:  Pelvic pain for 6 days. Free fluid in the pelvis identified on CT. Previous bilateral tubal ligation. Gravida 3 para 2 with negative quantitative beta HCG. LMP was possibly 11/27/2016.  EXAM: TRANSABDOMINAL AND TRANSVAGINAL ULTRASOUND OF PELVIS  DOPPLER ULTRASOUND OF OVARIES  TECHNIQUE: Both transabdominal and transvaginal ultrasound examinations of the pelvis were performed. Transabdominal technique was performed for global imaging of the pelvis including uterus, ovaries, adnexal regions, and pelvic cul-de-sac.  It was necessary to proceed with endovaginal exam following the transabdominal exam to visualize the adnexal regions and uterus. Color and duplex Doppler ultrasound was utilized to evaluate blood flow to the ovaries.  COMPARISON:  CT of the abdomen and pelvis on 12/17/2016  FINDINGS: Uterus  Measurements: At least 9.1 x 4.9 x 5.9 cm. No fibroids or other mass visualized.  Endometrium  Thickness: 4.2 mm.  No focal abnormality visualized.  Right ovary  Measurements: Right adnexal region measures 8.1 x 6.4 x 5.8  cm. Within the right adnexa, there are discrete cystic components. The largest  of these is 4.9 cm. 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 A smaller cystic component is 2.9 cm. Additional solid component is also noted.  Left ovary  Measurements: 3.4 x 1.9 x 2.6 cm. Complex solid area around the left ovary is 7.1 x 5.0 x 6.5 cm and may represent collapse.  Pulsed Doppler evaluation of both ovaries demonstrates normal low-resistance arterial and venous waveforms.  Other findings  A large amount of free pelvic fluid is present. Study quality is degraded by patient's pain.  IMPRESSION: 1. Complex appearance of the right adnexal region. Cystic components are consistent with a dermoid cyst. However, other solid components are present, likely indicating solid component of the mass as well as clot. 2. Left ovary is also surrounded by colonic and hemorrhage. 3. Normal appearance of the uterus. 4. Findings favor rupture and hemorrhage of a dermoid cyst. However, given the complexity of the findings and multiple solid components, malignancy has not been excluded.   MDM [id number]  Consult with Dr. Harolyn Rutherford regarding the plan of care HGB is currently stable at 8.6.  BP is stable - 98/60.  Does not have a surgical abdomen.  Client is talkative given Toradol IV and had pain decrease from 9/10 to 7/10.  Patient is dozing.   Assessment and Plan  Bleeding in pelvis due to Ruptured ovarian cyst - possible dermoid cyst from ultrasound report.  Plan Will discharge with pain management Return if pain worsens, or if you have increasing dizziness when standing. Drink at least 8 8-oz glasses of water every day. Eat healthy meals that have iron rich foods. Take a multivitamin daily. Expect the clinic to call you for a follow up appointment for a recheck.  And likely an  ultrasound will be needed in 4-6 weeks to recheck the right ovarian cyst identified on ultrasound. Has had a tubal ligation.   Salvadore Valvano L Dodie Parisi 12/17/2016, 8:13 AM

## 2016-12-17 NOTE — ED Notes (Signed)
MAU was called and report on pt was provided.

## 2016-12-17 NOTE — ED Notes (Signed)
Pt unable to provide urine specimen at this time

## 2016-12-18 LAB — ABO/RH: ABO/RH(D): A POS

## 2016-12-26 ENCOUNTER — Ambulatory Visit (INDEPENDENT_AMBULATORY_CARE_PROVIDER_SITE_OTHER): Payer: Self-pay | Admitting: Obstetrics & Gynecology

## 2016-12-26 ENCOUNTER — Encounter: Payer: Self-pay | Admitting: Obstetrics & Gynecology

## 2016-12-26 VITALS — BP 102/64 | HR 84 | Wt 132.2 lb

## 2016-12-26 DIAGNOSIS — D270 Benign neoplasm of right ovary: Secondary | ICD-10-CM | POA: Insufficient documentation

## 2016-12-26 NOTE — Progress Notes (Signed)
   Subjective:    Patient ID: Shannon Tucker, female    DOB: 11-Feb-1986, 31 y.o.   MRN: [id number]  HPI 31 yo MH P2 here for ER follow up. She was seen there for RLQ pain. A CT and u/s showed a ruptured ovarian cyst as well as an 8cm right dermoid cyst.  Review of Systems     Objective:   Physical Exam [place] [employer] (here for 9 years) Abd- benign      Assessment & Plan:  Symptomatic right dermoid cyst- plan for laparoscopic removal  I sent Drema Balzarine an email to schedule this.

## 2017-01-04 ENCOUNTER — Encounter (HOSPITAL_COMMUNITY): Payer: Self-pay | Admitting: *Deleted

## 2017-01-17 NOTE — Patient Instructions (Addendum)
Your procedure is scheduled on: Monday January 29, 2017 at 9:00 am  Enter through the [employer] of [facility] at: 7:30 am  Pick up the phone at the desk and dial [phone].  Call this number if you have problems the morning of surgery: [phone].  Remember: Do NOT eat food or drink any fluids after: Midnight on Sunday July 29 Do NOT drink clear liquids after: Take these medicines the morning of surgery with a SIP OF WATER:  None  STOP ALL VITAMINS AND SUPPLEMENTS NOW  Do NOT wear jewelry (body piercing), metal hair clips/bobby pins, make-up, or nail polish. Do NOT wear lotions, powders, or perfumes.  You may wear deoderant. Do NOT shave for 48 hours prior to surgery. Do NOT bring valuables to the hospital. Contacts, dentures, or bridgework may not be worn into surgery.  Have a responsible adult drive you home and stay with you for 24 hours after your procedure

## 2017-01-19 ENCOUNTER — Inpatient Hospital Stay (HOSPITAL_COMMUNITY)
Admission: RE | Admit: 2017-01-19 | Discharge: 2017-01-19 | Disposition: A | Discharge status: Home / Self Care | Payer: Self-pay | Source: Ambulatory Visit

## 2017-01-24 NOTE — Patient Instructions (Addendum)
Your procedure is scheduled on:  Monday, July 30  Enter through the Main Entrance of [facility] at: 7:30 am  Pick up the phone at the desk and dial [phone].  Call this number if you have problems the morning of surgery: [phone].  Remember: Do NOT eat food after or drink clear liquids (including water) after Midnight Sunday  Take these medicines the morning of surgery with a SIP OF WATER:  None . Stop herbal medications and supplements at this time.  Do NOT wear jewelry (body piercing), metal hair clips/bobby pins, make-up, or nail polish. Do NOT wear lotions, powders, or perfumes.  You may wear deoderant. Do NOT shave for 48 hours prior to surgery. Do NOT bring valuables to the hospital.  Have a responsible adult drive you home and stay with you for 24 hours after your procedure.  Home with Mother Myrtha Mantis cell [phone]

## 2017-01-25 ENCOUNTER — Encounter (HOSPITAL_COMMUNITY): Payer: Self-pay

## 2017-01-25 ENCOUNTER — Encounter (HOSPITAL_COMMUNITY)
Admission: RE | Admit: 2017-01-25 | Discharge: 2017-01-25 | Disposition: A | Discharge status: Home / Self Care | Payer: Self-pay | Source: Ambulatory Visit | Attending: Obstetrics & Gynecology | Admitting: Obstetrics & Gynecology

## 2017-01-25 DIAGNOSIS — Z01812 Encounter for preprocedural laboratory examination: Secondary | ICD-10-CM | POA: Insufficient documentation

## 2017-01-25 HISTORY — DX: Anemia, unspecified: D64.9

## 2017-01-25 LAB — CBC
HCT: 37.2 % (ref 36.0–46.0)
HEMOGLOBIN: 12.3 g/dL (ref 12.0–15.0)
MCH: 29.2 pg (ref 26.0–34.0)
MCHC: 33.1 g/dL (ref 30.0–36.0)
MCV: 88.4 fL (ref 78.0–100.0)
Platelets: 250 10*3/uL (ref 150–400)
RBC: 4.21 MIL/uL (ref 3.87–5.11)
RDW: 13.2 % (ref 11.5–15.5)
WBC: 10.3 10*3/uL (ref 4.0–10.5)

## 2017-01-26 ENCOUNTER — Encounter (HOSPITAL_COMMUNITY): Payer: Self-pay | Admitting: Anesthesiology

## 2017-01-29 ENCOUNTER — Ambulatory Visit (HOSPITAL_COMMUNITY): Payer: Self-pay | Admitting: Anesthesiology

## 2017-01-29 ENCOUNTER — Encounter (HOSPITAL_COMMUNITY): Payer: Self-pay

## 2017-01-29 ENCOUNTER — Ambulatory Visit (HOSPITAL_COMMUNITY)
Admission: RE | Admit: 2017-01-29 | Discharge: 2017-01-29 | Disposition: A | Discharge status: Home / Self Care | Payer: Self-pay | Source: Ambulatory Visit | Attending: Obstetrics & Gynecology | Admitting: Obstetrics & Gynecology

## 2017-01-29 ENCOUNTER — Encounter (HOSPITAL_COMMUNITY)
Admission: RE | Disposition: A | Discharge status: Home / Self Care | Payer: Self-pay | Source: Ambulatory Visit | Attending: Obstetrics & Gynecology

## 2017-01-29 DIAGNOSIS — D649 Anemia, unspecified: Secondary | ICD-10-CM | POA: Insufficient documentation

## 2017-01-29 DIAGNOSIS — Z79899 Other long term (current) drug therapy: Secondary | ICD-10-CM | POA: Insufficient documentation

## 2017-01-29 DIAGNOSIS — D270 Benign neoplasm of right ovary: Secondary | ICD-10-CM | POA: Insufficient documentation

## 2017-01-29 HISTORY — PX: LAPAROSCOPIC OVARIAN CYSTECTOMY: SHX6248

## 2017-01-29 LAB — PREGNANCY, URINE: PREG TEST UR: NEGATIVE

## 2017-01-29 SURGERY — EXCISION, CYST, OVARY, LAPAROSCOPIC
Anesthesia: General | Site: Abdomen | Laterality: Right

## 2017-01-29 MED ORDER — LIDOCAINE HCL (CARDIAC) 20 MG/ML IV SOLN
INTRAVENOUS | Status: AC
  Filled 2017-01-29: qty 5

## 2017-01-29 MED ORDER — DEXAMETHASONE SODIUM PHOSPHATE 4 MG/ML IJ SOLN
INTRAMUSCULAR | Status: DC | PRN
  Administered 2017-01-29: 10 mg via INTRAVENOUS

## 2017-01-29 MED ORDER — BUPIVACAINE HCL (PF) 0.5 % IJ SOLN
INTRAMUSCULAR | Status: DC | PRN
  Administered 2017-01-29: 13 mL

## 2017-01-29 MED ORDER — HYDROMORPHONE HCL 1 MG/ML IJ SOLN
0.2500 mg | INTRAMUSCULAR | Status: DC | PRN
  Administered 2017-01-29: 0.5 mg via INTRAVENOUS

## 2017-01-29 MED ORDER — BUPIVACAINE HCL (PF) 0.5 % IJ SOLN
INTRAMUSCULAR | Status: AC
  Filled 2017-01-29: qty 30

## 2017-01-29 MED ORDER — KETOROLAC TROMETHAMINE 30 MG/ML IJ SOLN
INTRAMUSCULAR | Status: DC | PRN
  Administered 2017-01-29: 30 mg via INTRAVENOUS

## 2017-01-29 MED ORDER — FENTANYL CITRATE (PF) 100 MCG/2ML IJ SOLN
INTRAMUSCULAR | Status: DC | PRN
  Administered 2017-01-29 (×5): 50 ug via INTRAVENOUS

## 2017-01-29 MED ORDER — MIDAZOLAM HCL 2 MG/2ML IJ SOLN
INTRAMUSCULAR | Status: AC
  Filled 2017-01-29: qty 2

## 2017-01-29 MED ORDER — FENTANYL CITRATE (PF) 250 MCG/5ML IJ SOLN
INTRAMUSCULAR | Status: AC
  Filled 2017-01-29: qty 5

## 2017-01-29 MED ORDER — KETOROLAC TROMETHAMINE 30 MG/ML IJ SOLN
INTRAMUSCULAR | Status: AC
  Filled 2017-01-29: qty 1

## 2017-01-29 MED ORDER — OXYCODONE HCL 5 MG PO TABS: 5.0000 mg | ORAL_TABLET | Freq: Once | ORAL | 0 refills | Status: DC | PRN

## 2017-01-29 MED ORDER — OXYCODONE HCL 5 MG/5ML PO SOLN: 5.0000 mg | Freq: Once | ORAL | Status: DC | PRN

## 2017-01-29 MED ORDER — HYDROMORPHONE HCL 1 MG/ML IJ SOLN
INTRAMUSCULAR | Status: AC
  Administered 2017-01-29: 0.5 mg via INTRAVENOUS
  Filled 2017-01-29: qty 0.5

## 2017-01-29 MED ORDER — HYDROMORPHONE HCL 1 MG/ML IJ SOLN
INTRAMUSCULAR | Status: AC
  Filled 2017-01-29: qty 1

## 2017-01-29 MED ORDER — HYDROMORPHONE HCL 1 MG/ML IJ SOLN
INTRAMUSCULAR | Status: DC | PRN
  Administered 2017-01-29: 1 mg via INTRAVENOUS

## 2017-01-29 MED ORDER — IBUPROFEN 600 MG PO TABS: 600.0000 mg | ORAL_TABLET | Freq: Four times a day (QID) | ORAL | 1 refills | Status: DC | PRN

## 2017-01-29 MED ORDER — LACTATED RINGERS IV SOLN
INTRAVENOUS | Status: DC
  Administered 2017-01-29 (×2): via INTRAVENOUS

## 2017-01-29 MED ORDER — ROCURONIUM BROMIDE 100 MG/10ML IV SOLN
INTRAVENOUS | Status: AC
  Filled 2017-01-29: qty 1

## 2017-01-29 MED ORDER — GLYCOPYRROLATE 0.2 MG/ML IJ SOLN
INTRAMUSCULAR | Status: DC | PRN
  Administered 2017-01-29: 0.1 mg via INTRAVENOUS
  Administered 2017-01-29: 0.6 mg via INTRAVENOUS

## 2017-01-29 MED ORDER — LIDOCAINE HCL (CARDIAC) 20 MG/ML IV SOLN
INTRAVENOUS | Status: DC | PRN
  Administered 2017-01-29: 50 mg via INTRAVENOUS

## 2017-01-29 MED ORDER — OXYCODONE HCL 5 MG PO TABS: 5.0000 mg | ORAL_TABLET | Freq: Once | ORAL | Status: DC | PRN

## 2017-01-29 MED ORDER — GLYCOPYRROLATE 0.2 MG/ML IJ SOLN
INTRAMUSCULAR | Status: AC
  Filled 2017-01-29: qty 3

## 2017-01-29 MED ORDER — ROCURONIUM BROMIDE 100 MG/10ML IV SOLN
INTRAVENOUS | Status: DC | PRN
  Administered 2017-01-29: 30 mg via INTRAVENOUS
  Administered 2017-01-29: 10 mg via INTRAVENOUS

## 2017-01-29 MED ORDER — NEOSTIGMINE METHYLSULFATE 10 MG/10ML IV SOLN
INTRAVENOUS | Status: AC
  Filled 2017-01-29: qty 1

## 2017-01-29 MED ORDER — NEOSTIGMINE METHYLSULFATE 10 MG/10ML IV SOLN
INTRAVENOUS | Status: DC | PRN
  Administered 2017-01-29: 3 mg via INTRAVENOUS

## 2017-01-29 MED ORDER — DEXAMETHASONE SODIUM PHOSPHATE 10 MG/ML IJ SOLN
INTRAMUSCULAR | Status: AC
  Filled 2017-01-29: qty 1

## 2017-01-29 MED ORDER — SCOPOLAMINE 1 MG/3DAYS TD PT72
1.0000 | MEDICATED_PATCH | Freq: Once | TRANSDERMAL | Status: DC
  Administered 2017-01-29: 1.5 mg via TRANSDERMAL

## 2017-01-29 MED ORDER — MIDAZOLAM HCL 2 MG/2ML IJ SOLN
INTRAMUSCULAR | Status: DC | PRN
  Administered 2017-01-29 (×2): 1 mg via INTRAVENOUS

## 2017-01-29 MED ORDER — ONDANSETRON HCL 4 MG/2ML IJ SOLN
INTRAMUSCULAR | Status: DC | PRN
  Administered 2017-01-29: 4 mg via INTRAVENOUS

## 2017-01-29 MED ORDER — PROPOFOL 10 MG/ML IV BOLUS
INTRAVENOUS | Status: DC | PRN
  Administered 2017-01-29: 150 mg via INTRAVENOUS
  Administered 2017-01-29: 50 mg via INTRAVENOUS

## 2017-01-29 MED ORDER — ONDANSETRON HCL 4 MG/2ML IJ SOLN
INTRAMUSCULAR | Status: AC
  Filled 2017-01-29: qty 2

## 2017-01-29 MED ORDER — SCOPOLAMINE 1 MG/3DAYS TD PT72
MEDICATED_PATCH | TRANSDERMAL | Status: AC
  Filled 2017-01-29: qty 1

## 2017-01-29 MED ORDER — PROPOFOL 10 MG/ML IV BOLUS
INTRAVENOUS | Status: AC
  Filled 2017-01-29: qty 20

## 2017-01-29 MED ORDER — PROMETHAZINE HCL 25 MG/ML IJ SOLN: 6.2500 mg | INTRAMUSCULAR | Status: DC | PRN

## 2017-01-29 SURGICAL SUPPLY — 35 items
CABLE HIGH FREQUENCY MONO STRZ (ELECTRODE) IMPLANT
CLOSURE WOUND 1/2 X4 (GAUZE/BANDAGES/DRESSINGS) ×1
CLOTH BEACON ORANGE TIMEOUT ST (SAFETY) ×3 IMPLANT
DRSG OPSITE POSTOP 3X4 (GAUZE/BANDAGES/DRESSINGS) ×3 IMPLANT
DURAPREP 26ML APPLICATOR (WOUND CARE) ×3 IMPLANT
ELECT REM PT RETURN 9FT ADLT (ELECTROSURGICAL) ×3
ELECTRODE REM PT RTRN 9FT ADLT (ELECTROSURGICAL) ×1 IMPLANT
GLOVE BIO SURGEON STRL SZ 6.5 (GLOVE) ×4 IMPLANT
GLOVE BIO SURGEONS STRL SZ 6.5 (GLOVE) ×2
GLOVE BIOGEL PI IND STRL 7.0 (GLOVE) ×2 IMPLANT
GLOVE BIOGEL PI INDICATOR 7.0 (GLOVE) ×4
GOWN STRL REUS W/TWL LRG LVL3 (GOWN DISPOSABLE) ×6 IMPLANT
NDL SAFETY ECLIPSE 18X1.5 (NEEDLE) ×1 IMPLANT
NEEDLE HYPO 18GX1.5 SHARP (NEEDLE) ×2
NEEDLE INSUFFLATION 120MM (ENDOMECHANICALS) ×3 IMPLANT
NS IRRIG 1000ML POUR BTL (IV SOLUTION) ×3 IMPLANT
PACK LAPAROSCOPY BASIN (CUSTOM PROCEDURE TRAY) ×3 IMPLANT
PACK TRENDGUARD 450 HYBRID PRO (MISCELLANEOUS) ×1 IMPLANT
PACK TRENDGUARD 600 HYBRD PROC (MISCELLANEOUS) IMPLANT
POUCH LAPAROSCOPIC INSTRUMENT (MISCELLANEOUS) ×3 IMPLANT
POUCH SPECIMEN RETRIEVAL 10MM (ENDOMECHANICALS) ×3 IMPLANT
PROTECTOR NERVE ULNAR (MISCELLANEOUS) ×6 IMPLANT
SET IRRIG TUBING LAPAROSCOPIC (IRRIGATION / IRRIGATOR) ×3 IMPLANT
SHEARS HARMONIC ACE PLUS 36CM (ENDOMECHANICALS) ×3 IMPLANT
SLEEVE XCEL OPT CAN 5 100 (ENDOMECHANICALS) ×3 IMPLANT
STRIP CLOSURE SKIN 1/2X4 (GAUZE/BANDAGES/DRESSINGS) ×2 IMPLANT
SUT VICRYL 0 UR6 27IN ABS (SUTURE) ×3 IMPLANT
SUT VICRYL 4-0 PS2 18IN ABS (SUTURE) ×3 IMPLANT
SYSTEM CARTER THOMASON II (TROCAR) ×3 IMPLANT
TOWEL OR 17X24 6PK STRL BLUE (TOWEL DISPOSABLE) ×6 IMPLANT
TRENDGUARD 450 HYBRID PRO PACK (MISCELLANEOUS) ×3
TRENDGUARD 600 HYBRID PROC PK (MISCELLANEOUS)
TROCAR OPTI TIP 5M 100M (ENDOMECHANICALS) ×3 IMPLANT
TROCAR XCEL DIL TIP R 11M (ENDOMECHANICALS) ×3 IMPLANT
WARMER LAPAROSCOPE (MISCELLANEOUS) ×3 IMPLANT

## 2017-01-29 NOTE — Discharge Instructions (Signed)
DISCHARGE INSTRUCTIONS: Laparoscopy ° °The following instructions have been prepared to help you care for yourself upon your return home today. ° °Wound care: °• Do not get the incision wet for the first 24 hours. The incision should be kept clean and dry. °• The Band-Aids or dressings may be removed the day after surgery. °• Should the incision become sore, red, and swollen after the first week, check with your doctor. ° °Personal hygiene: °• Shower the day after your procedure. ° °Activity and limitations: °• Do NOT drive or operate any equipment today. °• Do NOT lift anything more than 15 pounds for 2-3 weeks after surgery. °• Do NOT rest in bed all day. °• Walking is encouraged. Walk each day, starting slowly with 5-minute walks 3 or 4 times a day. Slowly increase the length of your walks. °• Walk up and down stairs slowly. °• Do NOT do strenuous activities, such as golfing, playing tennis, bowling, running, biking, weight lifting, gardening, mowing, or vacuuming for 2-4 weeks. Ask your doctor when it is okay to start. ° °Diet: Eat a light meal as desired this evening. You may resume your usual diet tomorrow. ° °Return to work: This is dependent on the type of work you do. For the most part you can return to a desk job within a week of surgery. If you are more active at work, please discuss this with your doctor. ° °What to expect after your surgery: You may have a slight burning sensation when you urinate on the first day. You may have a very small amount of blood in the urine. Expect to have a small amount of vaginal discharge/light bleeding for 1-2 weeks. It is not unusual to have abdominal soreness and bruising for up to 2 weeks. You may be tired and need more rest for about 1 week. You may experience shoulder pain for 24-72 hours. Lying flat in bed may relieve it. ° °Call your doctor for any of the following: °• Develop a fever of 100.4 or greater °• Inability to urinate 6 hours after discharge from  hospital °• Severe pain not relieved by pain medications °• Persistent of heavy bleeding at incision site °• Redness or swelling around incision site after a week °• Increasing nausea or vomiting ° °Patient Signature________________________________________ °Nurse Signature_________________________________________Diagnostic Laparoscopy, Care After °Refer to this sheet in the next few weeks. These instructions provide you with information about caring for yourself after your procedure. Your health care provider may also give you more specific instructions. Your treatment has been planned according to current medical practices, but problems sometimes occur. Call your health care provider if you have any problems or questions after your procedure. °What can I expect after the procedure? °After your procedure, it is common to have mild discomfort in the throat and abdomen. °Follow these instructions at home: °· Take over-the-counter and prescription medicines only as told by your health care provider. °· Do not drive for 24 hours if you received a sedative. °· Return to your normal activities as told by your health care provider. °· Do not take baths, swim, or use a hot tub until your health care provider approves. You may shower. °· Follow instructions from your health care provider about how to take care of your incision. Make sure you: °? Wash your hands with soap and water before you change your bandage (dressing). If soap and water are not available, use hand sanitizer. °? Change your dressing as told by your health care provider. °?   Leave stitches (sutures), skin glue, or adhesive strips in place. These skin closures may need to stay in place for 2 weeks or longer. If adhesive strip edges start to loosen and curl up, you may trim the loose edges. Do not remove adhesive strips completely unless your health care provider tells you to do that. °· Check your incision area every day for signs of infection. Check  for: °? More redness, swelling, or pain. °? More fluid or blood. °? Warmth. °? Pus or a bad smell. °· It is your responsibility to get the results of your procedure. Ask your health care provider or the department performing the procedure when your results will be ready. °Contact a health care provider if: °· There is new pain in your shoulders. °· You feel light-headed or faint. °· You are unable to pass gas or unable to have a bowel movement. °· You feel nauseous or you vomit. °· You develop a rash. °· You have more redness, swelling, or pain around your incision. °· You have more fluid or blood coming from your incision. °· Your incision feels warm to the touch. °· You have pus or a bad smell coming from your incision. °· You have a fever or chills. °Get help right away if: °· Your pain is getting worse. °· You have ongoing vomiting. °· The edges of your incision open up. °· You have trouble breathing. °· You have chest pain. °This information is not intended to replace advice given to you by your health care provider. Make sure you discuss any questions you have with your health care provider. °Document Released: 05/31/2015 Document Revised: 11/25/2015 Document Reviewed: 03/02/2015 °Elsevier Interactive Patient Education © 2018 [employer] ° °

## 2017-01-29 NOTE — Anesthesia Procedure Notes (Signed)
Procedure Name: Intubation Date/Time: 01/29/2017 9:17 AM Performed by: Flossie Dibble Pre-anesthesia Checklist: Timeout performed, Patient being monitored, Suction available, Emergency Drugs available and Patient identified Patient Re-evaluated:Patient Re-evaluated prior to induction Oxygen Delivery Method: Circle system utilized Preoxygenation: Pre-oxygenation with 100% oxygen Induction Type: IV induction Ventilation: Mask ventilation without difficulty Laryngoscope Size: Mac and 3 Grade View: Grade I Tube type: Oral Tube size: 7.0 mm Number of attempts: 1 Airway Equipment and Method: Stylet Placement Confirmation: ETT inserted through vocal cords under direct vision,  positive ETCO2 and breath sounds checked- equal and bilateral Secured at: 21 cm Tube secured with: Tape Dental Injury: Teeth and Oropharynx as per pre-operative assessment

## 2017-01-29 NOTE — H&P (Signed)
Shannon Tucker is an 31 y.o.  MH P2 here for ER follow up. She was seen there for RLQ pain. A CT and u/s showed a ruptured ovarian cyst as well as an 8cm right dermoid cyst. She will have it removed laparoscopically.      Patient's last menstrual period was 12/16/2016 (approximate).    Past Medical History:  Diagnosis Date  . Anemia   . Asthma    as a child, no prob as adult, no inhalers  . Ovarian cyst     Past Surgical History:  Procedure Laterality Date  . CESAREAN SECTION  2013, 2016   x 2  . SHOULDER SURGERY Right   . TUBAL LIGATION      History reviewed. No pertinent family history.  Social History:  reports that she has never smoked. She has never used smokeless tobacco. She reports that she does not drink alcohol or use drugs.  Allergies: No Known Allergies  Prescriptions Prior to Admission  Medication Sig Dispense Refill Last Dose  . Multiple Vitamin (MULTIVITAMIN WITH MINERALS) TABS tablet Take 1 tablet by mouth daily.   Past Week at Unknown time  . ibuprofen (ADVIL,MOTRIN) 600 MG tablet Take 1 tablet (600 mg total) by mouth every 6 (six) hours as needed. (Patient not taking: Reported on 01/18/2017) 30 tablet 0 Not Taking at Unknown time  . oxyCODONE-acetaminophen (PERCOCET/ROXICET) 5-325 MG tablet Take 1 tablet by mouth every 4 (four) hours as needed for severe pain. (Patient not taking: Reported on 12/26/2016) 12 tablet 0 Not Taking    ROS  Her periods sometimes skip. She works from home for a [employer].  Blood pressure 97/61, pulse 79, temperature 98 F (36.7 C), temperature source Oral, resp. rate 16, last menstrual period 12/16/2016, SpO2 100 %. Physical Exam Heart- rrr Lungs- CTAB Abd- benign Results for orders placed or performed during the hospital encounter of 01/29/17 (from the past 24 hour(s))  Pregnancy, urine     Status: None   Collection Time: 01/29/17  7:40 AM  Result Value Ref Range   Preg Test, Ur NEGATIVE NEGATIVE    No results  found.  Assessment/Plan: Right dermoid cyst- plan for removal laparoscopically.  She understands the risks of surgery, including, but not to infection, bleeding, DVTs, damage to bowel, bladder, ureters. She wishes to proceed.     Emily Filbert 01/29/2017, 8:36 AM

## 2017-01-29 NOTE — Transfer of Care (Signed)
Immediate Anesthesia Transfer of Care Note  Patient: Shannon Tucker  Procedure(s) Performed: Procedure(s): LAPAROSCOPIC REMOVAL RIGHT DERMOID CYST (Right)  Patient Location: PACU  Anesthesia Type:General  Level of Consciousness: awake, alert  and oriented  Airway & Oxygen Therapy: Patient Spontanous Breathing and Patient connected to nasal cannula oxygen  Post-op Assessment: Report given to RN and Post -op Vital signs reviewed and stable  Post vital signs: Reviewed and stable  Last Vitals:  Vitals:   01/29/17 0740  BP: 97/61  Pulse: 79  Resp: 16  Temp: 36.7 C    Last Pain:  Vitals:   01/29/17 0740  TempSrc: Oral      Patients Stated Pain Goal: 3 (29/29/09 0301)  Complications: No apparent anesthesia complications

## 2017-01-29 NOTE — Anesthesia Preprocedure Evaluation (Addendum)
Anesthesia Evaluation  Patient identified by MRN, date of birth, ID band Patient awake    Reviewed: Allergy & Precautions, NPO status , Patient's Chart, lab work & pertinent test results  Airway Mallampati: II  TM Distance: >3 FB Neck ROM: Full    Dental no notable dental hx.    Pulmonary neg pulmonary ROS,    Pulmonary exam normal breath sounds clear to auscultation       Cardiovascular negative cardio ROS Normal cardiovascular exam Rhythm:Regular Rate:Normal     Neuro/Psych negative neurological ROS  negative psych ROS   GI/Hepatic negative GI ROS, Neg liver ROS,   Endo/Other  negative endocrine ROS  Renal/GU negative Renal ROS     Musculoskeletal negative musculoskeletal ROS (+)   Abdominal   Peds  Hematology negative hematology ROS (+)   Anesthesia Other Findings hcg negative  Reproductive/Obstetrics                            Anesthesia Physical Anesthesia Plan  ASA: I  Anesthesia Plan: General   Post-op Pain Management:    Induction: Intravenous  PONV Risk Score and Plan: 3 and Ondansetron, Dexamethasone, Midazolam, Propofol infusion and Scopolamine patch - Pre-op  Airway Management Planned: Oral ETT  Additional Equipment:   Intra-op Plan:   Post-operative Plan: Extubation in OR  Informed Consent: I have reviewed the patients History and Physical, chart, labs and discussed the procedure including the risks, benefits and alternatives for the proposed anesthesia with the patient or authorized representative who has indicated his/her understanding and acceptance.   Dental advisory given  Plan Discussed with: CRNA  Anesthesia Plan Comments:         Anesthesia Quick Evaluation

## 2017-01-29 NOTE — Op Note (Addendum)
01/29/2017  10:19 AM  PATIENT:  Shannon Tucker  31 y.o. female  PRE-OPERATIVE DIAGNOSIS:  RIGHT DERMOID CYST  POST-OPERATIVE DIAGNOSIS:  RIGHT DERMOID CYST  PROCEDURE:  Procedure(s): LAPAROSCOPIC REMOVAL RIGHT DERMOID CYST (Right)  SURGEON:  Surgeon(s) and Role:    * Safaa Stingley, Wilhemina Cash, MD - Primary    * Aletha Halim, MD - Assisting   ANESTHESIA:   general  EBL:  Total I/O In: -  Out: 125 [Urine:125]  BLOOD ADMINISTERED:none  DRAINS: none   LOCAL MEDICATIONS USED:  MARCAINE     SPECIMEN:  Source of Specimen:  right dermoid cyst  DISPOSITION OF SPECIMEN:  PATHOLOGY  COUNTS:  YES  TOURNIQUET:  * No tourniquets in log *  DICTATION: .Dragon Dictation  PLAN OF CARE: Discharge to home after PACU  PATIENT DISPOSITION:  PACU - hemodynamically stable.   Delay start of Pharmacological VTE agent (>24hrs) due to surgical blood loss or risk of bleeding: not applicable  The risks, benefits, and alternatives of surgery were explained, understood, accepted.  In the operating room she was placed in the dorsal lithotomy position, and general anesthesia was given without complication. Her abdomen and vagina were prepped and draped in the usual sterile fashion. A timeout procedure was done. A bimanual exam revealed a small anteverted and mobile uterus. Her right adnexa was palpable in the pelvis. A Hulka manipulator was placed. Her bladder was emptied with a Robinson catheter. Gloves were changed, and attention was turned to the abdomen. Approximately the 5 mL of 0.5% Marcaine was injected into the umbilicus. A vertical incision was made at the site. A varies needle was placed intraperitoneally. Low-flow CO2 was used to insufflate the abdomen to approximately 3-1/2 L. Once a good pneumoperitoneum was established, a 5 mm Excel trocar was placed. Laparoscopy confirmed correct placement. A 5 mm port was placed in the right lower quadrant and a 10 mm port in the left lower quadrant under direct  laparoscopic visualization after injecting 0.5% Marcaine in the incision sites. Her  upper abdomen appeared normal. The left ovary appeared normal. The right ovary was enlarged to about 8 cm and stuck in the pelvis beneath the uterus with a large amount of filmy adhesions. These were bluntly broken up allowing the ovary to be placed beside/on top of the uterus. There was a moderate amount of clotted blood and inflammatory tissue in the pelvis.  A Harmonic scapel was used to score the ovarian capsule and we excised the dermoid by pulling the ovarian tissue away from the cyst. It was almost completely separated from the remainder of the ovary when a small hole appeared in the cyst. Only clear fluid came out of the cyst making me think that this was not a dermoid. However when I removed the cyst via an endopouch, I was able to see hair, so the ultrasound was correct and it was a dermoid. I copiously irrigated the pelvis. The remaining ovarian tissue was hemostatic. We removed the clots and inflammatory tissue from the pelvis.  The LLQ port fascia was closed with a 0 vicryl suture using the [employer] fascial closure device. No defects were palpable. A subcuticular closure was done with 4-0 Vicryl suture at all incision sites. A Steri-Strip was placed across each incision. I removed the Hulka manipulator. She was extubated and taken to the recovery room in stable condition.

## 2017-01-30 ENCOUNTER — Encounter (HOSPITAL_COMMUNITY): Payer: Self-pay | Admitting: Obstetrics & Gynecology

## 2017-01-30 NOTE — Anesthesia Postprocedure Evaluation (Signed)
Anesthesia Post Note  Patient: Shannon Tucker  Procedure(s) Performed: Procedure(s) (LRB): LAPAROSCOPIC REMOVAL RIGHT DERMOID CYST (Right)     Patient location during evaluation: PACU Anesthesia Type: General Level of consciousness: awake and alert Pain management: pain level controlled Vital Signs Assessment: post-procedure vital signs reviewed and stable Respiratory status: spontaneous breathing, nonlabored ventilation, respiratory function stable and patient connected to nasal cannula oxygen Cardiovascular status: blood pressure returned to baseline and stable Postop Assessment: no signs of nausea or vomiting Anesthetic complications: no    Last Vitals:  Vitals:   01/29/17 1200 01/29/17 1252  BP: 112/69 (!) 100/57  Pulse: 86 80  Resp: 13 18  Temp:      Last Pain:  Vitals:   01/29/17 1156  TempSrc: Oral  PainSc:                  Aimi Essner P Mat Stuard

## 2017-02-06 ENCOUNTER — Telehealth: Payer: Self-pay

## 2017-02-06 NOTE — Telephone Encounter (Signed)
Patient called wanting to know when should she come in for a follow up appointment for the laparoscopic removal of a Dermoid cyst. Per Dr. Roselie Awkward, patient should follow up in 4-6 weeks. Patient verbalized understanding and had no questions.

## 2017-03-08 ENCOUNTER — Ambulatory Visit: Payer: Self-pay | Admitting: Obstetrics & Gynecology

## 2021-04-14 ENCOUNTER — Other Ambulatory Visit: Payer: Self-pay

## 2021-04-14 ENCOUNTER — Encounter: Payer: Self-pay | Admitting: Medical

## 2021-04-14 ENCOUNTER — Ambulatory Visit (INDEPENDENT_AMBULATORY_CARE_PROVIDER_SITE_OTHER): Payer: 59 | Admitting: Medical

## 2021-04-14 VITALS — BP 114/74 | HR 72 | Temp 98.2°F | Resp 18 | Ht 64.0 in | Wt 145.0 lb

## 2021-04-14 DIAGNOSIS — D649 Anemia, unspecified: Secondary | ICD-10-CM

## 2021-04-14 DIAGNOSIS — Z23 Encounter for immunization: Secondary | ICD-10-CM | POA: Diagnosis not present

## 2021-04-14 DIAGNOSIS — J4520 Mild intermittent asthma, uncomplicated: Secondary | ICD-10-CM | POA: Diagnosis not present

## 2021-04-14 DIAGNOSIS — Z0000 Encounter for general adult medical examination without abnormal findings: Secondary | ICD-10-CM

## 2021-04-14 DIAGNOSIS — J029 Acute pharyngitis, unspecified: Secondary | ICD-10-CM

## 2021-04-14 DIAGNOSIS — Z113 Encounter for screening for infections with a predominantly sexual mode of transmission: Secondary | ICD-10-CM | POA: Diagnosis not present

## 2021-04-14 DIAGNOSIS — Z124 Encounter for screening for malignant neoplasm of cervix: Secondary | ICD-10-CM

## 2021-04-14 DIAGNOSIS — L732 Hidradenitis suppurativa: Secondary | ICD-10-CM

## 2021-04-14 LAB — CBC WITH DIFFERENTIAL/PLATELET
Basophils Absolute: 0.1 10*3/uL (ref 0.0–0.1)
Basophils Relative: 0.5 % (ref 0.0–3.0)
Eosinophils Absolute: 0.2 10*3/uL (ref 0.0–0.7)
Eosinophils Relative: 1.8 % (ref 0.0–5.0)
HCT: 39.3 % (ref 36.0–46.0)
Hemoglobin: 13 g/dL (ref 12.0–15.0)
Lymphocytes Relative: 24.7 % (ref 12.0–46.0)
Lymphs Abs: 2.4 10*3/uL (ref 0.7–4.0)
MCHC: 33.2 g/dL (ref 30.0–36.0)
MCV: 89.4 fl (ref 78.0–100.0)
Monocytes Absolute: 0.7 10*3/uL (ref 0.1–1.0)
Monocytes Relative: 6.8 % (ref 3.0–12.0)
Neutro Abs: 6.4 10*3/uL (ref 1.4–7.7)
Neutrophils Relative %: 66.2 % (ref 43.0–77.0)
Platelets: 216 10*3/uL (ref 150.0–400.0)
RBC: 4.4 Mil/uL (ref 3.87–5.11)
RDW: 13 % (ref 11.5–15.5)
WBC: 9.7 10*3/uL (ref 4.0–10.5)

## 2021-04-14 LAB — COMPREHENSIVE METABOLIC PANEL
ALT: 27 U/L (ref 0–35)
AST: 19 U/L (ref 0–37)
Albumin: 4.3 g/dL (ref 3.5–5.2)
Alkaline Phosphatase: 61 U/L (ref 39–117)
BUN: 11 mg/dL (ref 6–23)
CO2: 29 mEq/L (ref 19–32)
Calcium: 9.7 mg/dL (ref 8.4–10.5)
Chloride: 101 mEq/L (ref 96–112)
Creatinine, Ser: 0.49 mg/dL (ref 0.40–1.20)
GFR: 123.33 mL/min (ref >60.00)
Glucose, Bld: 84 mg/dL (ref 70–99)
Potassium: 4.5 mEq/L (ref 3.5–5.1)
Sodium: 137 mEq/L (ref 135–145)
Total Bilirubin: 0.4 mg/dL (ref 0.2–1.2)
Total Protein: 7.1 g/dL (ref 6.0–8.3)

## 2021-04-14 LAB — LDL CHOLESTEROL, DIRECT: Direct LDL: 118 mg/dL

## 2021-04-14 LAB — LIPID PANEL
Cholesterol: 200 mg/dL (ref 0–200)
HDL: 48.3 mg/dL (ref >39.00)
NonHDL: 151.39
Total CHOL/HDL Ratio: 4
Triglycerides: 220 mg/dL — ABNORMAL HIGH (ref 0.0–149.0)
VLDL: 44 mg/dL — ABNORMAL HIGH (ref 0.0–40.0)

## 2021-04-14 LAB — IRON: Iron: 86 ug/dL (ref 42–145)

## 2021-04-14 MED ORDER — ALBUTEROL SULFATE HFA 108 (90 BASE) MCG/ACT IN AERS: 2.0000 | INHALATION_SPRAY | Freq: Four times a day (QID) | RESPIRATORY_TRACT | 0 refills | Status: DC | PRN

## 2021-04-14 MED ORDER — CEPHALEXIN 500 MG PO CAPS: 500.0000 mg | ORAL_CAPSULE | Freq: Three times a day (TID) | ORAL | 0 refills | Status: DC

## 2021-04-14 NOTE — Patient Instructions (Addendum)
For you wellness exam today I have ordered cbc, cmp,lipid panel  and hiv.  Vaccine given today flu vaccine  Recommend exercise and healthy diet.  We will let you know lab results as they come in.  Referred for papsmear.  Follow up date appointment will be determined after lab review.     For hx of mild intermitttent asthma rx albuterol inhaler to use if needed.   For st and hydradenits supparativa rx keflex for 10 days. Can continue warm salt water compresses twice daily. If are persists or worsen follow up in 10-14 days or sooner.    Preventive Care 48-69 Years Old, Female Preventive care refers to lifestyle choices and visits with your health care provider that can promote health and wellness. This includes: A yearly physical exam. This is also called an annual wellness visit. Regular dental and eye exams. Immunizations. Screening for certain conditions. Healthy lifestyle choices, such as: Eating a healthy diet. Getting regular exercise. Not using drugs or products that contain nicotine and tobacco. Limiting alcohol use. What can I expect for my preventive care visit? Physical exam Your health care provider may check your: Height and weight. These may be used to calculate your BMI (body mass index). BMI is a measurement that tells if you are at a healthy weight. Heart rate and blood pressure. Body temperature. Skin for abnormal spots. Counseling Your health care provider may ask you questions about your: Past medical problems. Family's medical history. Alcohol, tobacco, and drug use. Emotional well-being. Home life and relationship well-being. Sexual activity. Diet, exercise, and sleep habits. Work and work Statistician. Access to firearms. Method of birth control. Menstrual cycle. Pregnancy history. What immunizations do I need? Vaccines are usually given at various ages, according to a schedule. Your health care provider will recommend vaccines for you based on  your age, medical history, and lifestyle or other factors, such as travel or where you work. What tests do I need? Blood tests Lipid and cholesterol levels. These may be checked every 5 years starting at age 38. Hepatitis C test. Hepatitis B test. Screening Diabetes screening. This is done by checking your blood sugar (glucose) after you have not eaten for a while (fasting). STD (sexually transmitted disease) testing, if you are at risk. BRCA-related cancer screening. This may be done if you have a family history of breast, ovarian, tubal, or peritoneal cancers. Pelvic exam and Pap test. This may be done every 3 years starting at age 34. Starting at age 46, this may be done every 5 years if you have a Pap test in combination with an HPV test. Talk with your health care provider about your test results, treatment options, and if necessary, the need for more tests. Follow these instructions at home: Eating and drinking  Eat a healthy diet that includes fresh fruits and vegetables, whole grains, lean protein, and low-fat dairy products. Take vitamin and mineral supplements as recommended by your health care provider. Do not drink alcohol if: Your health care provider tells you not to drink. You are pregnant, may be pregnant, or are planning to become pregnant. If you drink alcohol: Limit how much you have to 0-1 drink a day. Be aware of how much alcohol is in your drink. In the [place] one drink equals one 12 oz bottle of beer (355 mL), one 5 oz glass of wine (148 mL), or one 1 oz glass of hard liquor (44 mL). Lifestyle Take daily care of your teeth and gums. Brush your  teeth every morning and night with fluoride toothpaste. Floss one time each day. Stay active. Exercise for at least 30 minutes 5 or more days each week. Do not use any products that contain nicotine or tobacco, such as cigarettes, e-cigarettes, and chewing tobacco. If you need help quitting, ask your health care provider. Do  not use drugs. If you are sexually active, practice safe sex. Use a condom or other form of protection to prevent STIs (sexually transmitted infections). If you do not wish to become pregnant, use a form of birth control. If you plan to become pregnant, see your health care provider for a prepregnancy visit. Find healthy ways to cope with stress, such as: Meditation, yoga, or listening to music. Journaling. Talking to a trusted person. Spending time with friends and family. Safety Always wear your seat belt while driving or riding in a vehicle. Do not drive: If you have been drinking alcohol. Do not ride with someone who has been drinking. When you are tired or distracted. While texting. Wear a helmet and other protective equipment during sports activities. If you have firearms in your house, make sure you follow all gun safety procedures. Seek help if you have been physically or sexually abused. What's next? Go to your health care provider once a year for an annual wellness visit. Ask your health care provider how often you should have your eyes and teeth checked. Stay up to date on all vaccines. This information is not intended to replace advice given to you by your health care provider. Make sure you discuss any questions you have with your health care provider. Document Revised: 08/27/2020 Document Reviewed: 02/28/2018 Elsevier Patient Education  2022 [employer].

## 2021-04-14 NOTE — Progress Notes (Signed)
Subjective:    Patient ID: Shannon Tucker, female    DOB: [birth year], 35 y.o.   MRN: [id number]  HPI Pt in for first time. States long time since had cpe/wellness exam.  Pt not exercising on regular basis. Admits not real healthy. Some fried foods but does eat fruits and vegetable. Nonsmoker. 2 glasses of wine a week. 2 children. 60 yo and 86 yo.    Hx of asthma. Had last flare one year ago. Worse when younger. Years since had flare. Pt does not have inhaler available to use.  History of anemia 3 times when youth. Also had 4 years ago. No fatigue recently. Pt thinks hx of iron deficiency.  Last papsmear 4-5 years ago.  Last flu vaccine year ago. Will get today. She will get tdap later.  LMP- 03-22-21.  Pt had 2 covid vaccines.   Also rt axillary region of redness for 5 days. Mild tender. Pt applied some warm salt water compresses. She states when she applied this saw some serosanguinous.   Over past month on and off st and she states tonsil mild swollen. But not presently. States throughout her life tonsils would swell intermittent.    Review of Systems  Constitutional:  Negative for chills, fatigue and fever.  HENT:  Negative for congestion, drooling, ear discharge and ear pain.        See hpi.  Respiratory:  Negative for cough, chest tightness, shortness of breath and wheezing.   Cardiovascular:  Negative for chest pain and palpitations.  Gastrointestinal:  Negative for abdominal pain.  Genitourinary:  Negative for dyspareunia, dysuria, flank pain and frequency.  Musculoskeletal:  Negative for back pain, myalgias and neck stiffness.  Skin:  Negative for pallor and rash.       See hpi on axillary issue  Neurological:  Negative for dizziness, seizures, syncope, weakness, numbness and headaches.  Hematological:  Negative for adenopathy. Does not bruise/bleed easily.  Psychiatric/Behavioral:  Negative for behavioral problems, confusion, decreased concentration, sleep  disturbance and suicidal ideas. The patient is not nervous/anxious.     Past Medical History:  Diagnosis Date   Anemia    Asthma    as a child, no prob as adult, no inhalers   Ovarian cyst      Social History   Socioeconomic History   Marital status: Single    Spouse name: Not on file   Number of children: Not on file   Years of education: Not on file   Highest education level: Not on file  Occupational History   Not on file  Tobacco Use   Smoking status: Never   Smokeless tobacco: Never  Vaping Use   Vaping Use: Never used  Substance and Sexual Activity   Alcohol use: Yes    Alcohol/week: 2.0 standard drinks    Types: 2 Glasses of wine per week   Drug use: No   Sexual activity: Yes    Birth control/protection: Surgical  Other Topics Concern   Not on file  Social History Narrative   Not on file   Social Determinants of Health   Financial Resource Strain: Not on file  Food Insecurity: Not on file  Transportation Needs: Not on file  Physical Activity: Not on file  Stress: Not on file  Social Connections: Not on file  Intimate Partner Violence: Not on file    Past Surgical History:  Procedure Laterality Date   CESAREAN SECTION  2013, 2016   x 2   LAPAROSCOPIC OVARIAN CYSTECTOMY  Right 01/29/2017   Procedure: LAPAROSCOPIC REMOVAL RIGHT DERMOID CYST;  Surgeon: Emily Filbert, MD;  Location: [facility];  Service: Gynecology;  Laterality: Right;   SHOULDER SURGERY Right    TUBAL LIGATION      History reviewed. No pertinent family history.  No Known Allergies  Current Outpatient Medications on File Prior to Visit  Medication Sig Dispense Refill   ibuprofen (ADVIL,MOTRIN) 600 MG tablet Take 1 tablet (600 mg total) by mouth every 6 (six) hours as needed. 30 tablet 1   Multiple Vitamin (MULTIVITAMIN WITH MINERALS) TABS tablet Take 1 tablet by mouth daily.     No current facility-administered medications on file prior to visit.    BP 114/74   Pulse 72   Temp 98.2  F (36.8 C)   Resp 18   Ht 5\' 4"  (1.626 m)   Wt 145 lb (65.8 kg)   SpO2 100%   BMI 24.89 kg/m       Objective:   Physical Exam   General Mental Status- Alert. General Appearance- Not in acute distress.   Skin General: Color- Normal Color. Moisture- Normal Moisture.  Neck Carotid Arteries- Normal color. Moisture- Normal Moisture. No carotid bruits. No JVD.  Chest and Lung Exam Auscultation: Breath Sounds:-Normal.  Cardiovascular Auscultation:Rythm- Regular. Murmurs & Other Heart Sounds:Auscultation of the heart reveals- No Murmurs.  Abdomen Inspection:-Inspeection Normal. Palpation/Percussion:Note:No mass. Palpation and Percussion of the abdomen reveal- Non Tender, Non Distended + BS, no rebound or guarding.   Neurologic Cranial Nerve exam:- CN III-XII intact(No nystagmus), symmetric smile. Strength:- 5/5 equal and symmetric strength both upper and lower extremities.   Heent- no sinus pressure. Posterior pharynx mild red. No tonsil hypertrophy. No lymphadenopathy of neck.  Rt axillary area- small raised red bump about 10 mm size. No dc. Mild tender. Consistent with h supparativa.     Assessment & Plan:   Patient Instructions  For you wellness exam today I have ordered cbc, cmp,lipid panel  and hiv.  Vaccine given today flu vaccine  Recommend exercise and healthy diet.  We will let you know lab results as they come in.  Referred for papsmear.  Follow up date appointment will be determined after lab review.     For hx of mild intermitttent asthma rx albuterol inhaler to use if needed.   Mackie Pai, PA-C

## 2021-04-14 NOTE — Addendum Note (Signed)
Addended by: Jeronimo Greaves on: 04/14/2021 10:04 AM   Modules accepted: Orders

## 2021-05-06 ENCOUNTER — Other Ambulatory Visit: Payer: Self-pay | Admitting: Medical

## 2021-06-07 ENCOUNTER — Ambulatory Visit: Payer: 59 | Admitting: Medical

## 2021-06-09 ENCOUNTER — Other Ambulatory Visit (HOSPITAL_COMMUNITY)
Admission: RE | Admit: 2021-06-09 | Discharge: 2021-06-09 | Disposition: A | Discharge status: Home / Self Care | Payer: 59 | Source: Ambulatory Visit | Attending: Obstetrics and Gynecology | Admitting: Obstetrics and Gynecology

## 2021-06-09 ENCOUNTER — Ambulatory Visit (INDEPENDENT_AMBULATORY_CARE_PROVIDER_SITE_OTHER): Payer: 59 | Admitting: Obstetrics and Gynecology

## 2021-06-09 ENCOUNTER — Encounter: Payer: Self-pay | Admitting: Obstetrics and Gynecology

## 2021-06-09 ENCOUNTER — Other Ambulatory Visit: Payer: Self-pay

## 2021-06-09 VITALS — BP 109/73 | HR 80 | Ht 64.0 in | Wt 139.0 lb

## 2021-06-09 DIAGNOSIS — N926 Irregular menstruation, unspecified: Secondary | ICD-10-CM | POA: Diagnosis not present

## 2021-06-09 DIAGNOSIS — Z01419 Encounter for gynecological examination (general) (routine) without abnormal findings: Secondary | ICD-10-CM | POA: Insufficient documentation

## 2021-06-09 MED ORDER — NORETHIN ACE-ETH ESTRAD-FE 1-20 MG-MCG(24) PO TABS: 1.0000 | ORAL_TABLET | Freq: Every day | ORAL | 11 refills | Status: DC

## 2021-06-09 NOTE — Progress Notes (Signed)
Subjective:     Shannon Tucker is a 35 y.o. female P2 with LMP 06/09/21 who is here for a comprehensive physical exam. The patient reports no problems. She reports an irregular period occuring every 1-2 months. She states her bleeding last 5-6 days. She is sexually active using BTL for contraception. She denies pelvic pain or abnormal discharge. She denies urinary incontinence. Patient is interested in regulating her cycles  Past Medical History:  Diagnosis Date   Anemia    Asthma    as a child, no prob as adult, no inhalers   Ovarian cyst    Past Surgical History:  Procedure Laterality Date   CESAREAN SECTION  2013, 2016   x 2   LAPAROSCOPIC OVARIAN CYSTECTOMY Right 01/29/2017   Procedure: LAPAROSCOPIC REMOVAL RIGHT DERMOID CYST;  Surgeon: Emily Filbert, MD;  Location: [facility];  Service: Gynecology;  Laterality: Right;   SHOULDER SURGERY Right    TUBAL LIGATION     History reviewed. No pertinent family history.  Social History   Socioeconomic History   Marital status: Single    Spouse name: Not on file   Number of children: Not on file   Years of education: Not on file   Highest education level: Not on file  Occupational History   Not on file  Tobacco Use   Smoking status: Never   Smokeless tobacco: Never  Vaping Use   Vaping Use: Never used  Substance and Sexual Activity   Alcohol use: Yes    Alcohol/week: 2.0 standard drinks    Types: 2 Glasses of wine per week   Drug use: No   Sexual activity: Yes    Birth control/protection: Surgical  Other Topics Concern   Not on file  Social History Narrative   Not on file   Social Determinants of Health   Financial Resource Strain: Not on file  Food Insecurity: Not on file  Transportation Needs: Not on file  Physical Activity: Not on file  Stress: Not on file  Social Connections: Not on file  Intimate Partner Violence: Not on file   Health Maintenance  Topic Date Due   Pneumococcal Vaccine 101-82 Years old (1 - PCV)  Never done   HIV Screening  Never done   Hepatitis C Screening  Never done   TETANUS/TDAP  Never done   PAP SMEAR-Modifier  Never done   INFLUENZA VACCINE  Completed   HPV VACCINES  Aged Out       Review of Systems Pertinent items noted in HPI and remainder of comprehensive ROS otherwise negative.   Objective:  Blood pressure 109/73, pulse 80, height 5\' 4"  (1.626 m), weight 139 lb (63 kg), last menstrual period 06/09/2021.   GENERAL: Well-developed, well-nourished female in no acute distress.  HEENT: Normocephalic, atraumatic. Sclerae anicteric.  NECK: Supple. Normal thyroid.  LUNGS: Clear to auscultation bilaterally.  HEART: Regular rate and rhythm. BREASTS: Symmetric in size. No palpable masses or lymphadenopathy, skin changes, or nipple drainage. ABDOMEN: Soft, nontender, nondistended. No organomegaly. PELVIC: Normal external female genitalia. Vagina is pink and rugated.  Normal discharge. Normal appearing cervix. Uterus is normal in size. No adnexal mass or tenderness. Chaperone present during the pelvic exam EXTREMITIES: No cyanosis, clubbing, or edema, 2+ distal pulses.     Assessment:    Healthy female exam.      Plan:    Pap smear collected CBC and TSH ordered to evaluate irregular bleeding Discussed hormonal contraception for cycle control- patient opted for birth control  pills Patient will be contacted with abnormal results See After Visit Summary for Counseling Recommendations

## 2021-06-10 LAB — TSH: TSH: 0.996 u[IU]/mL (ref 0.450–4.500)

## 2021-06-10 LAB — CBC
Hematocrit: 37.3 % (ref 34.0–46.6)
Hemoglobin: 12.5 g/dL (ref 11.1–15.9)
MCH: 29.8 pg (ref 26.6–33.0)
MCHC: 33.5 g/dL (ref 31.5–35.7)
MCV: 89 fL (ref 79–97)
Platelets: 226 10*3/uL (ref 150–450)
RBC: 4.2 x10E6/uL (ref 3.77–5.28)
RDW: 13 % (ref 11.7–15.4)
WBC: 10.6 10*3/uL (ref 3.4–10.8)

## 2021-06-14 LAB — CYTOLOGY - PAP
Adequacy: ABSENT
Comment: NEGATIVE
Diagnosis: NEGATIVE
High risk HPV: NEGATIVE

## 2021-06-16 ENCOUNTER — Ambulatory Visit: Payer: 59 | Admitting: Medical

## 2021-09-01 ENCOUNTER — Ambulatory Visit (HOSPITAL_BASED_OUTPATIENT_CLINIC_OR_DEPARTMENT_OTHER)
Admission: RE | Admit: 2021-09-01 | Discharge: 2021-09-01 | Disposition: A | Discharge status: Home / Self Care | Payer: 59 | Source: Ambulatory Visit | Attending: Medical | Admitting: Medical

## 2021-09-01 ENCOUNTER — Ambulatory Visit (INDEPENDENT_AMBULATORY_CARE_PROVIDER_SITE_OTHER): Payer: 59 | Admitting: Medical

## 2021-09-01 ENCOUNTER — Other Ambulatory Visit: Payer: Self-pay

## 2021-09-01 ENCOUNTER — Encounter: Payer: Self-pay | Admitting: Medical

## 2021-09-01 VITALS — BP 108/68 | HR 72 | Temp 98.2°F | Resp 18 | Ht 64.0 in | Wt 138.4 lb

## 2021-09-01 DIAGNOSIS — M79641 Pain in right hand: Secondary | ICD-10-CM | POA: Diagnosis not present

## 2021-09-01 DIAGNOSIS — M79642 Pain in left hand: Secondary | ICD-10-CM

## 2021-09-01 LAB — C-REACTIVE PROTEIN: CRP: <1 mg/dL (ref 0.5–20.0)

## 2021-09-01 LAB — SEDIMENTATION RATE: Sed Rate: 6 mm/hr (ref 0–20)

## 2021-09-01 MED ORDER — MELOXICAM 7.5 MG PO TABS: 7.5000 mg | ORAL_TABLET | Freq: Every day | ORAL | 0 refills | Status: DC

## 2021-09-01 NOTE — Progress Notes (Signed)
? ?Subjective:  ? ? Patient ID: Shannon Tucker, female    DOB: 10-20-1985, 36 y.o.   MRN: [id number] ? ?HPI ? ?Pt in with new  bilateral hand pain since December. Pain is low-moderate. Left hand pain worse than rt. She is left handed. Hurts to grip and twist. Also hurts to type. No trauma. ? ?Parents and sibling no RA. Pain not worse on rainy day. ? ?Has not tried any meds. ? ?Lmp- 08-06-2021 approx. Pt states not late. ? ?Review of Systems  ?Constitutional:  Negative for chills, fatigue and fever.  ?Respiratory:  Negative for cough, chest tightness, shortness of breath and wheezing.   ?Cardiovascular:  Negative for chest pain and palpitations.  ?Gastrointestinal:  Negative for abdominal pain, blood in stool, constipation and diarrhea.  ?Musculoskeletal:   ?     Hand pain.  ?Skin:  Negative for pallor and rash.  ?Neurological:  Negative for dizziness, seizures and light-headedness.  ?Hematological:  Negative for adenopathy. Does not bruise/bleed easily.  ?Psychiatric/Behavioral:  Negative for behavioral problems and confusion.   ? ? ?Past Medical History:  ?Diagnosis Date  ? Anemia   ? Asthma   ? as a child, no prob as adult, no inhalers  ? Ovarian cyst   ? ?  ?Social History  ? ?Socioeconomic History  ? Marital status: Single  ?  Spouse name: Not on file  ? Number of children: Not on file  ? Years of education: Not on file  ? Highest education level: Not on file  ?Occupational History  ? Not on file  ?Tobacco Use  ? Smoking status: Never  ? Smokeless tobacco: Never  ?Vaping Use  ? Vaping Use: Never used  ?Substance and Sexual Activity  ? Alcohol use: Yes  ?  Alcohol/week: 2.0 standard drinks  ?  Types: 2 Glasses of wine per week  ? Drug use: No  ? Sexual activity: Yes  ?  Birth control/protection: Surgical  ?Other Topics Concern  ? Not on file  ?Social History Narrative  ? Not on file  ? ?Social Determinants of Health  ? ?Financial Resource Strain: Not on file  ?Food Insecurity: Not on file  ?Transportation Needs:  Not on file  ?Physical Activity: Not on file  ?Stress: Not on file  ?Social Connections: Not on file  ?Intimate Partner Violence: Not on file  ? ? ?Past Surgical History:  ?Procedure Laterality Date  ? CESAREAN SECTION  2013, 2016  ? x 2  ? LAPAROSCOPIC OVARIAN CYSTECTOMY Right 01/29/2017  ? Procedure: LAPAROSCOPIC REMOVAL RIGHT DERMOID CYST;  Surgeon: Emily Filbert, MD;  Location: [facility];  Service: Gynecology;  Laterality: Right;  ? SHOULDER SURGERY Right   ? TUBAL LIGATION    ? ? ?No family history on file. ? ?No Known Allergies ? ?No current outpatient medications on file prior to visit.  ? ?No current facility-administered medications on file prior to visit.  ? ? ?BP 108/68   Pulse (!) 40   Temp 98.2 ?F (36.8 ?C)   Resp 18   Ht 5\' 4"  (1.626 m)   Wt 138 lb 6.4 oz (62.8 kg)   SpO2 95%   BMI 23.76 kg/m?  ? Pulse 72. ? ?   ?Objective:  ? Physical Exam ? ?General ?Mental Status- Alert. General Appearance- Not in acute distress.  ? ?Skin ?General: Color- Normal Color. Moisture- Normal Moisture. ? ?Neck ?Carotid Arteries- Normal color. Moisture- Normal Moisture. No carotid bruits. No JVD. ? ?Chest and Lung Exam ?Auscultation: ?  Breath Sounds:-Normal. ? ?Cardiovascular ?Auscultation:Rythm- Regular. ?Murmurs & Other Heart Sounds:Auscultation of the heart reveals- No Murmurs. ? ? ?Neurologic ?Cranial Nerve exam:- CN III-XII intact(No nystagmus), symmetric smile. ?Strength:- 5/5 equal and symmetric strength both upper and lower extremities.  ? ?Hands- bilateral hands mild swollen. Mild pain on palpation mcp and pip joints.  ?   ?Assessment & Plan:  ? ?Patient Instructions  ?Bilateral hand pain. Worse left side. Will get left hand xray and inflammatory lab studies.  ? ?Pending lab and imaging rx meloxicam to take 7.5 mg to take 1 tab daily. ? ?Follow up in 10-14 days or sooner if needed  ?Mackie Pai, PA-C  ?

## 2021-09-01 NOTE — Patient Instructions (Signed)
Bilateral hand pain. Worse left side. Will get left hand xray and inflammatory lab studies.  ? ?Pending lab and imaging rx meloxicam to take 7.5 mg to take 1 tab daily. ? ?Follow up in 10-14 days or sooner if needed ?

## 2021-09-02 LAB — RHEUMATOID FACTOR: Rhuematoid fact SerPl-aCnc: <14 IU/mL (ref <14)

## 2021-09-02 LAB — ANA: Anti Nuclear Antibody (ANA): NEGATIVE

## 2021-09-15 ENCOUNTER — Ambulatory Visit: Payer: 59 | Admitting: Medical

## 2021-09-28 ENCOUNTER — Other Ambulatory Visit: Payer: Self-pay | Admitting: Medical

## 2021-10-17 ENCOUNTER — Ambulatory Visit: Payer: Self-pay | Admitting: Medical

## 2021-10-30 ENCOUNTER — Other Ambulatory Visit: Payer: Self-pay | Admitting: Medical

## 2022-01-12 ENCOUNTER — Ambulatory Visit: Payer: Self-pay | Admitting: Family

## 2022-01-26 ENCOUNTER — Other Ambulatory Visit: Payer: Self-pay | Admitting: Medical

## 2022-05-16 ENCOUNTER — Encounter: Payer: Self-pay | Admitting: General Practice

## 2022-05-17 ENCOUNTER — Ambulatory Visit: Payer: Self-pay | Admitting: Medical

## 2022-07-03 DIAGNOSIS — Z419 Encounter for procedure for purposes other than remedying health state, unspecified: Secondary | ICD-10-CM | POA: Diagnosis not present

## 2022-07-07 DIAGNOSIS — J40 Bronchitis, not specified as acute or chronic: Secondary | ICD-10-CM | POA: Diagnosis not present

## 2022-07-07 DIAGNOSIS — Z20822 Contact with and (suspected) exposure to covid-19: Secondary | ICD-10-CM | POA: Diagnosis not present

## 2022-07-07 DIAGNOSIS — R059 Cough, unspecified: Secondary | ICD-10-CM | POA: Diagnosis not present

## 2022-08-03 DIAGNOSIS — Z419 Encounter for procedure for purposes other than remedying health state, unspecified: Secondary | ICD-10-CM | POA: Diagnosis not present

## 2022-09-01 DIAGNOSIS — Z419 Encounter for procedure for purposes other than remedying health state, unspecified: Secondary | ICD-10-CM | POA: Diagnosis not present

## 2022-09-14 ENCOUNTER — Ambulatory Visit (INDEPENDENT_AMBULATORY_CARE_PROVIDER_SITE_OTHER): Payer: 59 | Admitting: Family Medicine

## 2022-09-14 ENCOUNTER — Encounter: Payer: Self-pay | Admitting: Family Medicine

## 2022-09-14 VITALS — BP 118/72 | HR 87 | Temp 98.3°F | Ht 64.0 in | Wt 149.0 lb

## 2022-09-14 DIAGNOSIS — R0981 Nasal congestion: Secondary | ICD-10-CM

## 2022-09-14 DIAGNOSIS — R051 Acute cough: Secondary | ICD-10-CM

## 2022-09-14 DIAGNOSIS — R0602 Shortness of breath: Secondary | ICD-10-CM | POA: Diagnosis not present

## 2022-09-14 LAB — POC COVID19 BINAXNOW: SARS Coronavirus 2 Ag: NEGATIVE

## 2022-09-14 LAB — POCT INFLUENZA A/B
Influenza A, POC: NEGATIVE
Influenza B, POC: NEGATIVE

## 2022-09-14 MED ORDER — GUAIFENESIN ER 600 MG PO TB12: 1200.0000 mg | ORAL_TABLET | Freq: Two times a day (BID) | ORAL | 0 refills | Status: DC

## 2022-09-14 MED ORDER — BENZONATATE 200 MG PO CAPS: 200.0000 mg | ORAL_CAPSULE | Freq: Two times a day (BID) | ORAL | 0 refills | Status: DC | PRN

## 2022-09-14 MED ORDER — PREDNISONE 20 MG PO TABS: 40.0000 mg | ORAL_TABLET | Freq: Every day | ORAL | 0 refills | Status: AC

## 2022-09-14 NOTE — Progress Notes (Signed)
Acute Office Visit  Subjective:     Patient ID: Nonya Pahl, female    DOB: [birth year], 37 y.o.   MRN: [id number]  CC: cough    HPI Patient is in today for cough.   Patient reports she started feeling poorly yesterday. Symptoms have included dry cough, congestion, shortness of breath at times, fatigue. She has been taking Flonase with no improvement. She denies sick contacts, sore throat, headache/sinus pressure, ear pain, rhinorrhea, GI/GU symptoms, wheezing, chest pain, fevers. Reports she had bronchitis in January and seems to be getting sick every month or so.       ROS All review of systems negative except what is listed in the HPI      Objective:    BP 118/72   Pulse 87   Temp 98.3 F (36.8 C)   Ht '5\' 4"'$  (1.626 m)   Wt 149 lb (67.6 kg)   SpO2 99%   BMI 25.58 kg/m    Physical Exam Vitals reviewed.  Constitutional:      Appearance: Normal appearance.  HENT:     Right Ear: Tympanic membrane normal.     Left Ear: Tympanic membrane normal.     Nose: Congestion present.     Mouth/Throat:     Mouth: Mucous membranes are moist.     Pharynx: Oropharynx is clear. No oropharyngeal exudate or posterior oropharyngeal erythema.  Cardiovascular:     Rate and Rhythm: Normal rate and regular rhythm.     Pulses: Normal pulses.     Heart sounds: Normal heart sounds.  Pulmonary:     Effort: Pulmonary effort is normal.     Breath sounds: Normal breath sounds. No wheezing, rhonchi or rales.  Musculoskeletal:     Cervical back: Normal range of motion and neck supple. No tenderness.  Lymphadenopathy:     Cervical: No cervical adenopathy.  Skin:    General: Skin is warm and dry.  Neurological:     Mental Status: She is alert and oriented to person, place, and time.  Psychiatric:        Mood and Affect: Mood normal.        Behavior: Behavior normal.        Thought Content: Thought content normal.        Judgment: Judgment normal.     Results for orders placed  or performed in visit on 09/14/22  POCT Influenza A/B  Result Value Ref Range   Influenza A, POC Negative Negative   Influenza B, POC Negative Negative  POC COVID-19 BinaxNow  Result Value Ref Range   SARS Coronavirus 2 Ag Negative Negative        Assessment & Plan:   Problem List Items Addressed This Visit   None Visit Diagnoses     Acute cough    -  Primary   Relevant Medications   guaiFENesin (MUCINEX) 600 MG 12 hr tablet   benzonatate (TESSALON) 200 MG capsule   predniSONE (DELTASONE) 20 MG tablet   Other Relevant Orders   POCT Influenza A/B (Completed)   POC COVID-19 BinaxNow (Completed)   Ambulatory referral to Allergy   Nasal congestion       Relevant Orders   POCT Influenza A/B (Completed)   POC COVID-19 BinaxNow (Completed)   Shortness of breath       Relevant Medications   predniSONE (DELTASONE) 20 MG tablet      Negative Flu and COVID A little too early to start antibiotics. Recommend continuing supportive measures for  now. Adding Mucinex for you.  Continue supportive measures including rest, hydration, humidifier use, steam showers, warm compresses to sinuses, warm liquids with lemon and honey, and over-the-counter cough, cold, and analgesics as needed.  I would recommend seeing an Allergist as you seem to be having recurrent issues. Adding prednisone and Tessalon for you to try.     Meds ordered this encounter  Medications   guaiFENesin (MUCINEX) 600 MG 12 hr tablet    Sig: Take 2 tablets (1,200 mg total) by mouth 2 (two) times daily.    Dispense:  30 tablet    Refill:  0    Order Specific Question:   Supervising Provider    Answer:   Penni Homans A [[id number]]   benzonatate (TESSALON) 200 MG capsule    Sig: Take 1 capsule (200 mg total) by mouth 2 (two) times daily as needed for cough.    Dispense:  20 capsule    Refill:  0    Order Specific Question:   Supervising Provider    Answer:   Penni Homans A [[id number]]   predniSONE (DELTASONE) 20 MG tablet     Sig: Take 2 tablets (40 mg total) by mouth daily with breakfast for 5 days.    Dispense:  10 tablet    Refill:  0    Order Specific Question:   Supervising Provider    Answer:   Penni Homans A [[id number]]    Return if symptoms worsen or fail to improve.  Terrilyn Saver, NP

## 2022-09-14 NOTE — Patient Instructions (Addendum)
Negative Flu and COVID A little too early to start antibiotics. Recommend continuing supportive measures for now. Adding Mucinex for you.  Continue supportive measures including rest, hydration, humidifier use, steam showers, warm compresses to sinuses, warm liquids with lemon and honey, and over-the-counter cough, cold, and analgesics as needed.  I would recommend seeing an Allergist as you seem to be having recurrent issues. Adding prednisone and Tessalon for you to try.

## 2022-10-02 DIAGNOSIS — Z419 Encounter for procedure for purposes other than remedying health state, unspecified: Secondary | ICD-10-CM | POA: Diagnosis not present

## 2022-10-15 NOTE — Progress Notes (Signed)
NEW PATIENT Date of Service/Encounter:  10/16/22 Referring provider: Clayborne Dana, NP Primary care provider: Marisue Brooklyn  Subjective:  Shannon Tucker is a 37 y.o. female with a PMHx of dermoid cyst of right ovary presenting today for evaluation of cough. History obtained from: chart review and patient.   Asthma:  Monthly having flu-like symptoms since having Covid 2019.  Had childhood asthma which seems to have worsened in the past few years.  Given albuterol and prednisone about one month ago. Children both have asthma. Triggers: gasoline and chlorox trigger coughing spells. She has not ever been on a controller inhaler. Hospitalized in childhood for asthma, but not as an adult.  She is getting sick monthly since September 2023.  She is being treated with antibiotics and prednisone. Has had OCS at least 3 times in past 12 months. No smoking history. Using albuterol about 2 times per month, feels it is helpful. Main symptoms are chest tighteness and cough.  Cough is daily. Wakes her up at night around 5 times per month since September.  Never diagnosed with PNA. Her vaccines are UTD.  Rhinitis:  Sneezing, no ocular symptoms. Worse in winter or during weather changes. Never allergy testing. Denies hx of reflux. Never diagnosed with OSA. No snoring. No hx of prior ENT surgeries. PCP notes from referral on 09/14/22 for acute, recurrent cough. Prescribed guanfisen and prednisone.  Taking cetirizine PRN. Use fluticasone PRN as well.   Adverse food reaction:  Occasionally reports red rashes on her arms when eating, but no particular food trigger identified.  She is not avoiding any specific foods.  Rash:  Gets patches on her back and upper arms and cheeks. Not using any topical ointments for this. Does use aveeno.  Other allergy screening: Food allergy: no Medication allergy: no Hymenoptera allergy: no Vaccinations are up to date.   Past Medical History: Past  Medical History:  Diagnosis Date   Anemia    Asthma    as a child, no prob as adult, no inhalers   Ovarian cyst    Medication List:  Current Outpatient Medications  Medication Sig Dispense Refill   albuterol (VENTOLIN HFA) 108 (90 Base) MCG/ACT inhaler Inhale 2 puffs into the lungs every 6 (six) hours as needed for wheezing or shortness of breath. 8 g 2   azelastine (ASTELIN) 0.1 % nasal spray Place 2 sprays into both nostrils 2 (two) times daily as needed for rhinitis. Use in each nostril as directed 30 mL 5   budesonide-formoterol (SYMBICORT) 80-4.5 MCG/ACT inhaler Inhale 2 puffs into the lungs in the morning and at bedtime. 1 each 3   cetirizine (ZYRTEC ALLERGY) 10 MG tablet Take 1 tablet (10 mg total) by mouth daily. 30 tablet 3   fluticasone (FLONASE) 50 MCG/ACT nasal spray Place 2 sprays into both nostrils daily. 16 g 2   meloxicam (MOBIC) 7.5 MG tablet TAKE 1 TABLET(7.5 MG) BY MOUTH DAILY 30 tablet 0   Spacer/Aero-Holding Chambers (AEROCHAMBER PLUS WITH MASK) inhaler Use with inhaler 1 each 1   benzonatate (TESSALON) 200 MG capsule Take 1 capsule (200 mg total) by mouth 2 (two) times daily as needed for cough. 20 capsule 0   guaiFENesin (MUCINEX) 600 MG 12 hr tablet Take 2 tablets (1,200 mg total) by mouth 2 (two) times daily. 30 tablet 0   No current facility-administered medications for this visit.   Known Allergies:  No Known Allergies Past Surgical History: Past Surgical History:  Procedure Laterality Date  CESAREAN SECTION  2013, 2016   x 2   LAPAROSCOPIC OVARIAN CYSTECTOMY Right 01/29/2017   Procedure: LAPAROSCOPIC REMOVAL RIGHT DERMOID CYST;  Surgeon: Allie Bossier, MD;  Location: [facility];  Service: Gynecology;  Laterality: Right;   SHOULDER SURGERY Right    TUBAL LIGATION     Family History: Family History  Problem Relation Age of Onset   Asthma Child    Asthma Child    Social History: Nelli lives in a house built in 1994, no water damage, tile floors, gas and  electric heating, no pets, no roaches, using dust mite protection on the pillows and bedding, no smoke exposure, works in an office, 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  + HEPA filter in the home.  [place]   ROS:  All other systems negative except as noted per HPI.  Objective:  Blood pressure 114/74, pulse 82, temperature 98.2 F (36.8 C), temperature source Temporal, resp. rate 20, height 5' 4.25" (1.632 m), weight 149 lb 3.2 oz (67.7 kg), SpO2 98 %. Body mass index is 25.41 kg/m. Physical Exam:  General Appearance:  Alert, cooperative, no distress, appears stated age  Head:  Normocephalic, without obvious abnormality, atraumatic  Eyes:  Conjunctiva clear, EOM's intact  Nose: Nares normal, hypertrophic turbinates, normal mucosa, no visible anterior polyps, and septum midline  Throat: Lips, tongue normal; teeth and gums normal, normal posterior oropharynx, tonsils 2+, and no tonsillar exudate  Neck: Supple, symmetrical  Lungs:   clear to auscultation bilaterally, Respirations unlabored, no coughing  Heart:  regular rate and rhythm and no murmur, Appears well perfused  Extremities: No edema  Skin: Erythematous papules on bilateral upper arms and back and Skin color, texture, turgor normal  Neurologic: No gross deficits     Diagnostics: Spirometry:  Tracings reviewed. Her effort: Variable effort-results affected. Technique noted to be poor. This was corrected in clinic on post examination. FVC: 2.77L (pre), 2.92L  (post) FEV1: 1.73L, 60% predicted (pre), 2.26L, 78% predicted (post) FEV1/FVC ratio: 0.62 (pre), 0.77 (post) Interpretation: Spirometry consistent with mixed obstructive and restrictive disease with significant bronchodilator response-technique still poor.   Skin Testing: Environmental allergy panel and select foods.  Adequate controls. Results discussed with patient/family.  Airborne Adult Perc - 10/16/22 0950     Time Antigen  Placed 0945    Allergen Manufacturer Waynette Buttery    Location Back    Number of Test 59    1. Control-Buffer 50% Glycerol Negative    2. Control-Histamine 1 mg/ml 3+    3. Albumin saline Negative    4. Bahia Negative    5. French Southern Territories Negative    6. Johnson Negative    7. Kentucky Blue Negative    8. Meadow Fescue Negative    9. Perennial Rye Negative    10. Sweet Vernal Negative    11. Timothy Negative    12. Cocklebur 2+    13. Burweed Marshelder Negative    14. Ragweed, short Negative    15. Ragweed, Giant Negative    16. Plantain,  English Negative    17. Lamb's Quarters Negative    18. Sheep Sorrell Negative    19. Rough Pigweed Negative    20. Marsh Elder, Rough Negative    21. Mugwort, Common 2+    22. Ash mix Negative    23. Birch mix Negative    24. Beech American 2+    25. Box, Elder Negative    26. Cedar, red Negative  27. Cottonwood, Eastern Negative    28. Elm mix Negative    29. Hickory Negative    30. Maple mix Negative    31. Oak, [place] mix 2+    32. Pecan Pollen Negative    33. Pine mix Negative    34. Sycamore Eastern Negative    35. Walnut, Black Pollen Negative    36. Alternaria alternata Negative    37. Cladosporium Herbarum Negative    38. Aspergillus mix Negative    39. Penicillium mix Negative    40. Bipolaris sorokiniana (Helminthosporium) Negative    41. Drechslera spicifera (Curvularia) Negative    42. Mucor plumbeus Negative    43. Fusarium moniliforme Negative    44. Aureobasidium pullulans (pullulara) Negative    45. Rhizopus oryzae Negative    46. Botrytis cinera Negative    47. Epicoccum nigrum Negative    48. Phoma betae Negative    49. Candida Albicans Negative    50. Trichophyton mentagrophytes Negative    51. Mite, D Farinae  5,000 AU/ml Negative    52. Mite, D Pteronyssinus  5,000 AU/ml Negative    53. Cat Hair 10,000 BAU/ml Negative    54.  Dog Epithelia Negative    55. Mixed Feathers Negative    56. Horse Epithelia Negative     57. Cockroach, German Negative    58. Mouse Negative    59. Tobacco Leaf Negative    Comments n             Intradermal - 10/16/22 1017     Time Antigen Placed 1300    Location Arm    Number of Test 13    Intradermal Select    Control Negative    French Southern Territories Negative    Johnson Negative    7 Grass Negative    Ragweed mix Negative    Mold 1 Negative    Mold 2 2+    Mold 3 Negative    Mold 4 Negative    Cat Negative    Dog Negative    Cockroach 4+    Mite mix Negative             Food Adult Perc - 10/16/22 0900     Time Antigen Placed 0945    Allergen Manufacturer Waynette Buttery    Location Back    Number of allergen test 10    1. Peanut Negative    2. Soybean Negative    3. Wheat Negative    4. Sesame Negative    5. Milk, cow Negative    6. Egg White, Chicken Negative    7. Casein Negative    8. Shellfish Mix Negative    9. Fish Mix Negative    10. Cashew Negative             Allergy testing results were read and interpreted by myself, documented by clinical staff.  Assessment and Plan  Mild Persistent Asthma: - your lung testing today showed obstruction with improvement after being given albuterol, consistent with asthma - Controller Inhaler: Start Symbicort 80 mcg 2 puffs twice a day; This Should Be Used Everyday - Rinse mouth out after use - Rescue Inhaler: Albuterol (Proair/Ventolin) 2 puffs . Use  every 4-6 hours as needed for chest tightness, wheezing, or coughing.  Can also use 15 minutes prior to exercise if you have symptoms with activity. - Asthma is not controlled if:  - Symptoms are occurring >2 times a week OR  - >2 times a  month nighttime awakenings  - You are requiring systemic steroids (prednisone/steroid injections) more than once per year  - Your require hospitalization for your asthma.  - Please call the clinic to schedule a follow up if these symptoms arise  Chronic Rhinitis: determined to be Seasonal and Perennial Allergic: - allergy  testing today: skin positive to weed pollen, tree pollen, intradermals positive to indoor molds (aspergillus and penicillium) and cockroach - Prevention:  - allergen avoidance when possible - consider allergy shots as long term control of your symptoms by teaching your immune system to be more tolerant of your allergy triggers - Symptom control: - Continue Nasal Steroid Spray: Best results if used daily. - Options include Flonase (fluticasone), Nasocort (triamcinolone), Nasonex (mometasome) 1- 2 sprays in each nostril daily.  - All can be purchased over-the-counter if not covered by insurance. - Start Astelin (azelastine) 1-2 sprays in each nostril twice a day as needed for nasal congestion/itchy nose - Continue Antihistamine: daily or daily as needed.   -Options include Zyrtec (Cetirizine) 10mg , Claritin (Loratadine) 10mg , Allegra (Fexofenadine) 180mg , or Xyzal (Levocetirinze) 5mg  - Can be purchased over-the-counter if not covered by insurance.  Rash: Keratosis Pilaris:  - this is a benign condition - use an over-the-counter lotion containing lactic acid or ammonium lactate (such as LacHydrin) up to twice daily on bumpy skin  Follow up : 6-8 weeks, sooner if needed It was a pleasure meeting you in clinic today! Thank you for allowing me to participate in your care.  This note in its entirety was forwarded to the Provider who requested this consultation.  Thank you for your kind referral. I appreciate the opportunity to take part in [place] care. Please do not hesitate to contact me with questions.  Sincerely,  Tonny Bollman, MD [facility]

## 2022-10-16 ENCOUNTER — Encounter: Payer: Self-pay | Admitting: Internal Medicine

## 2022-10-16 ENCOUNTER — Ambulatory Visit (INDEPENDENT_AMBULATORY_CARE_PROVIDER_SITE_OTHER): Payer: 59 | Admitting: Internal Medicine

## 2022-10-16 ENCOUNTER — Other Ambulatory Visit: Payer: Self-pay

## 2022-10-16 VITALS — BP 114/74 | HR 82 | Temp 98.2°F | Resp 20 | Ht 64.25 in | Wt 149.2 lb

## 2022-10-16 DIAGNOSIS — J310 Chronic rhinitis: Secondary | ICD-10-CM

## 2022-10-16 DIAGNOSIS — T781XXA Other adverse food reactions, not elsewhere classified, initial encounter: Secondary | ICD-10-CM

## 2022-10-16 DIAGNOSIS — J3089 Other allergic rhinitis: Secondary | ICD-10-CM

## 2022-10-16 DIAGNOSIS — J302 Other seasonal allergic rhinitis: Secondary | ICD-10-CM

## 2022-10-16 DIAGNOSIS — L858 Other specified epidermal thickening: Secondary | ICD-10-CM | POA: Diagnosis not present

## 2022-10-16 DIAGNOSIS — J4530 Mild persistent asthma, uncomplicated: Secondary | ICD-10-CM | POA: Diagnosis not present

## 2022-10-16 MED ORDER — AZELASTINE HCL 0.1 % NA SOLN: 2.0000 | Freq: Two times a day (BID) | NASAL | 5 refills | Status: DC | PRN

## 2022-10-16 MED ORDER — CETIRIZINE HCL 10 MG PO TABS: 10.0000 mg | ORAL_TABLET | Freq: Every day | ORAL | 3 refills | Status: AC

## 2022-10-16 MED ORDER — FLUTICASONE PROPIONATE 50 MCG/ACT NA SUSP: 2.0000 | Freq: Every day | NASAL | 2 refills | Status: DC

## 2022-10-16 MED ORDER — AEROCHAMBER PLUS FLO-VU MISC: 1 refills | Status: DC

## 2022-10-16 MED ORDER — BUDESONIDE-FORMOTEROL FUMARATE 80-4.5 MCG/ACT IN AERO: 2.0000 | INHALATION_SPRAY | Freq: Two times a day (BID) | RESPIRATORY_TRACT | 3 refills | Status: DC

## 2022-10-16 MED ORDER — ALBUTEROL SULFATE HFA 108 (90 BASE) MCG/ACT IN AERS: 2.0000 | INHALATION_SPRAY | Freq: Four times a day (QID) | RESPIRATORY_TRACT | 2 refills | Status: DC | PRN

## 2022-10-16 NOTE — Patient Instructions (Signed)
Mild Persistent Asthma: - your lung testing today showed obstruction with improvement after being given albuterol, consistent with asthma - Controller Inhaler: Start Symbicort 80 mcg 2 puffs twice a day; This Should Be Used Everyday - Rinse mouth out after use - Rescue Inhaler: Albuterol (Proair/Ventolin) 2 puffs . Use  every 4-6 hours as needed for chest tightness, wheezing, or coughing.  Can also use 15 minutes prior to exercise if you have symptoms with activity. - Asthma is not controlled if:  - Symptoms are occurring >2 times a week OR  - >2 times a month nighttime awakenings  - You are requiring systemic steroids (prednisone/steroid injections) more than once per year  - Your require hospitalization for your asthma.  - Please call the clinic to schedule a follow up if these symptoms arise  Chronic Rhinitis: determined to be Seasonal and Perennial Allergic: - allergy testing today: skin positive to weed pollen, tree pollen, intradermals positive to indoor molds (aspergillus and penicillium) and cockroach - Prevention:  - allergen avoidance when possible - consider allergy shots as long term control of your symptoms by teaching your immune system to be more tolerant of your allergy triggers - Symptom control: - Continue Nasal Steroid Spray: Best results if used daily. - Options include Flonase (fluticasone), Nasocort (triamcinolone), Nasonex (mometasome) 1- 2 sprays in each nostril daily.  - All can be purchased over-the-counter if not covered by insurance. - Start Astelin (azelastine) 1-2 sprays in each nostril twice a day as needed for nasal congestion/itchy nose - Continue Antihistamine: daily or daily as needed.   -Options include Zyrtec (Cetirizine) 10mg , Claritin (Loratadine) 10mg , Allegra (Fexofenadine) 180mg , or Xyzal (Levocetirinze) 5mg  - Can be purchased over-the-counter if not covered by insurance.  Rash: Keratosis Pilaris:  - this is a benign condition - use an  over-the-counter lotion containing lactic acid or ammonium lactate (such as LacHydrin) up to twice daily on bumpy skin  Follow up : 6-8 weeks, sooner if needed It was a pleasure meeting you in clinic today! Thank you for allowing me to participate in your care.  Tonny Bollman, MD [facility]  Reducing Pollen Exposure  The American Academy of Allergy, Asthma and Immunology suggests the following steps to reduce your exposure to pollen during allergy seasons.    Do not hang sheets or clothing out to dry; pollen may collect on these items. Do not mow lawns or spend time around freshly cut grass; mowing stirs up pollen. Keep windows closed at night.  Keep car windows closed while driving. Minimize morning activities outdoors, a time when pollen counts are usually at their highest. Stay indoors as much as possible when pollen counts or humidity is high and on windy days when pollen tends to remain in the air longer. Use air conditioning when possible.  Many air conditioners have filters that trap the pollen spores. Use a HEPA room air filter to remove pollen form the indoor air you breathe. Control of Mold Allergen   Mold and fungi can grow on a variety of surfaces provided certain temperature and moisture conditions exist.  Outdoor molds grow on plants, decaying vegetation and soil.  The major outdoor mold, Alternaria and Cladosporium, are found in very high numbers during hot and dry conditions.  Generally, a late Summer - Fall peak is seen for common outdoor fungal spores.  Rain will temporarily lower outdoor mold spore count, but counts rise rapidly when the rainy period ends.  The most important indoor molds  are Aspergillus and Penicillium.  Dark, humid and poorly ventilated basements are ideal sites for mold growth.  The next most common sites of mold growth are the bathroom and the kitchen.  Indoor (Perennial) Mold Control   Maintain humidity below 50%. Clean washable  surfaces with 5% bleach solution. Remove sources e.g. contaminated carpets. Control of Cockroach Allergen  Cockroach allergen has been identified as an important cause of acute attacks of asthma, especially in urban settings.  There are fifty-five species of cockroach that exist in the [place], however only three, the [place], [place] species produce allergen that can affect patients with Asthma.  Allergens can be obtained from fecal particles, egg casings and secretions from cockroaches.    Remove food sources. Reduce access to water. Seal access and entry points. Spray runways with 0.5-1% Diazinon or Chlorpyrifos Blow boric acid power under stoves and refrigerator. Place bait stations (hydramethylnon) at feeding sites.

## 2022-11-01 DIAGNOSIS — Z419 Encounter for procedure for purposes other than remedying health state, unspecified: Secondary | ICD-10-CM | POA: Diagnosis not present

## 2022-11-07 ENCOUNTER — Other Ambulatory Visit: Payer: Self-pay | Admitting: Internal Medicine

## 2022-12-01 ENCOUNTER — Ambulatory Visit: Payer: 59 | Admitting: Internal Medicine

## 2022-12-02 DIAGNOSIS — Z419 Encounter for procedure for purposes other than remedying health state, unspecified: Secondary | ICD-10-CM | POA: Diagnosis not present

## 2022-12-17 NOTE — Progress Notes (Deleted)
FOLLOW UP Date of Service/Encounter:  12/17/22   Subjective:  Shannon Tucker (DOB: 06/07/1986) is a 37 y.o. female who [facility] in re-evaluation of the following: asthma, allergic rhinitis, keratosis pilaris History obtained from: chart review and {Persons; PED relatives w/patient:[id number]::"patient"}.  For Review, LV was on 10/16/22  with Dr.Ersie Savino seen for intial visit for asthma, allergic rhinitis, keratosis pilaris . See below for summary of history and diagnostics.  Therapeutic plans/changes recommended: start symbicort 80 mcg 2 puffs BID, PRN: INCS, astelin, 2nd generation AH, lacyhdrin ----------------------------------------------------- Pertinent History/Diagnostics:  Asthma: Childhood asthma, worsened after COVID 19 infection, Hospitalized in childhood but not as an adult. 3 rounds OCS in past year. No smoking history. Sx: chest tightness and cough. Vaccines UTD. Triggers: irritants. 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 Worse during change of weather. No prior ENT surgeries. - SPT environmental panel (10/16/22): positive to weed pollen, tree pollen, intradermals positive to indoor molds (aspergillus and penicillium) and cockroach  Other: kertasosis pilaris, red rashes on arms with eating, no specific triggers; most common food allergy panel negative 10/16/22 --------------------------------------------------- Today presents for follow-up. ***  Allergies as of 12/18/2022   No Known Allergies      Medication List        Accurate as of December 17, 2022  3:10 PM. If you have any questions, ask your nurse or doctor.          aerochamber plus with mask inhaler Use with inhaler   albuterol 108 (90 Base) MCG/ACT inhaler Commonly known as: VENTOLIN  HFA Inhale 2 puffs into the lungs every 6 (six) hours as needed for wheezing or shortness of breath.   azelastine 0.1 % nasal spray Commonly known as: ASTELIN Place 2 sprays into both nostrils 2 (two) times daily as needed for rhinitis. Use in each nostril as directed   benzonatate 200 MG capsule Commonly known as: TESSALON Take 1 capsule (200 mg total) by mouth 2 (two) times daily as needed for cough.   budesonide-formoterol 80-4.5 MCG/ACT inhaler Commonly known as: Symbicort Inhale 2 puffs into the lungs in the morning and at bedtime.   cetirizine 10 MG tablet Commonly known as: ZyrTEC Allergy Take 1 tablet (10 mg total) by mouth daily.   fluticasone 50 MCG/ACT nasal spray Commonly known as: FLONASE SPRAY 2 SPRAYS INTO EACH NOSTRIL EVERY DAY   guaiFENesin 600 MG 12 hr tablet Commonly known as: Mucinex Take 2 tablets (1,200 mg total) by mouth 2 (two) times daily.   meloxicam 7.5 MG tablet Commonly known as: MOBIC TAKE 1 TABLET(7.5 MG) BY MOUTH DAILY       Past Medical History:  Diagnosis Date   Anemia    Asthma    as a child, no prob as adult, no inhalers   Ovarian cyst    Past Surgical History:  Procedure Laterality Date   CESAREAN SECTION  2013, 2016   x 2   LAPAROSCOPIC OVARIAN CYSTECTOMY Right 01/29/2017   Procedure: LAPAROSCOPIC REMOVAL RIGHT DERMOID CYST;  Surgeon: Allie Bossier, MD;  Location: [facility];  Service: Gynecology;  Laterality: Right;   SHOULDER SURGERY Right    TUBAL LIGATION     Otherwise, there have been no changes to her past medical history, surgical history, family history, or social history.  ROS: All others negative except as  noted per HPI.   Objective:  There were no vitals taken for this visit. There is no height or weight on file to calculate BMI. Physical Exam: General Appearance:  Alert, cooperative, no distress, appears stated age  Head:  Normocephalic, without obvious abnormality, atraumatic  Eyes:  Conjunctiva clear, EOM's  intact  Nose: Nares normal, {Blank multiple:19196:a:"***","hypertrophic turbinates","normal mucosa","no visible anterior polyps","septum midline"}  Throat: Lips, tongue normal; teeth and gums normal, {Blank multiple:19196:a:"***","normal posterior oropharynx","tonsils 2+","tonsils 3+","no tonsillar exudate","+ cobblestoning","surgically absent tonsils"}  Neck: Supple, symmetrical  Lungs:   {Blank multiple:19196:a:"***","clear to auscultation bilaterally","end-expiratory wheezing","wheezing throughout"}, Respirations unlabored, {Blank multiple:19196:a:"***","no coughing","intermittent dry coughing"}  Heart:  {Blank multiple:19196:a:"***","regular rate and rhythm","no murmur"}, Appears well perfused  Extremities: No edema  Skin: {Blank multiple:19196:a:"***","Skin color, texture, turgor normal","no rashes or lesions on visualized portions of skin"}  Neurologic: No gross deficits   Labs: ***  Spirometry:  Tracings reviewed. Her effort: {Blank single:19197::"Good reproducible efforts.","It was hard to get consistent efforts and there is a question as to whether this reflects a maximal maneuver.","Poor effort, data can not be interpreted.","Variable effort-results affected","effort okay for first attempt at spirometry.","Results not reproducible due to ***"} FVC: ***L FEV1: ***L, ***% predicted FEV1/FVC ratio: ***% Interpretation: {Blank single:19197::"Spirometry consistent with mild obstructive disease","Spirometry consistent with moderate obstructive disease","Spirometry consistent with severe obstructive disease","Spirometry consistent with possible restrictive disease","Spirometry consistent with mixed obstructive and restrictive disease","Spirometry uninterpretable due to technique","Spirometry consistent with normal pattern","No overt abnormalities noted given today's efforts","Nonobstructive ratio, low FEV1","Nonobstructive ratio, low FEV1, possible restriction"}.  Please see scanned spirometry  results for details.  Skin Testing: {Blank single:19197::"Select foods","Environmental allergy panel","Environmental allergy panel and select foods","Food allergy panel","None","Deferred due to recent antihistamines use","deferred due to recent reaction","Pediatric Environmental Allergy Panel","Pediatric Food Panel","Select foods and environmental allergies"}. {Blank single:19197::"Adequate positive and negative controls","Inadequate positive control-testing invalid","Adequate positive and negative controls, dermatographism present, testing difficult to interpret"}. Results discussed with patient/family.   {Blank single:19197::"Allergy testing results were read and interpreted by myself, documented by clinical staff.","Allergy testing results were read by ***,FNP, documented by clinical staff"}  Assessment/Plan   ***  Tonny Bollman, MD  [facility]

## 2022-12-18 ENCOUNTER — Ambulatory Visit: Payer: 59 | Admitting: Internal Medicine

## 2022-12-18 IMAGING — DX DG HAND COMPLETE 3+V*L*
3 series · 3 of 3 positions shown · non-contrast
Comparison: None.

CLINICAL DATA: Left hand pain for 2 months.

EXAM:
LEFT HAND - COMPLETE 3+ VIEW

[hand pa]
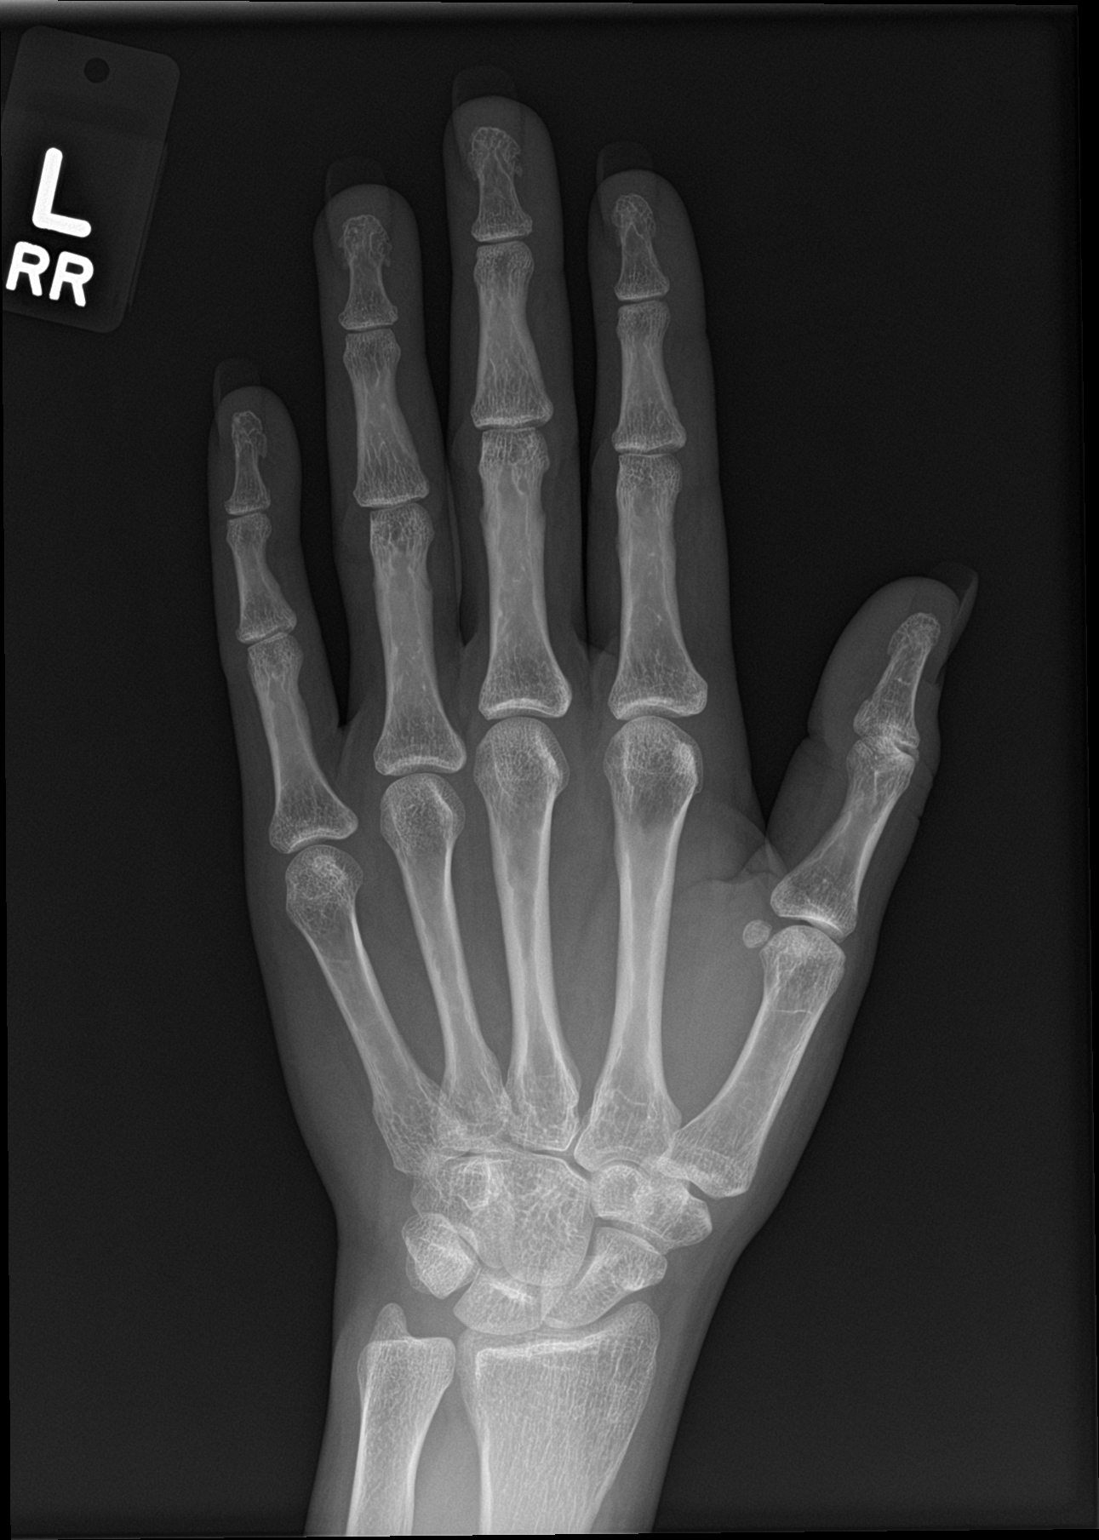

[hand obl]
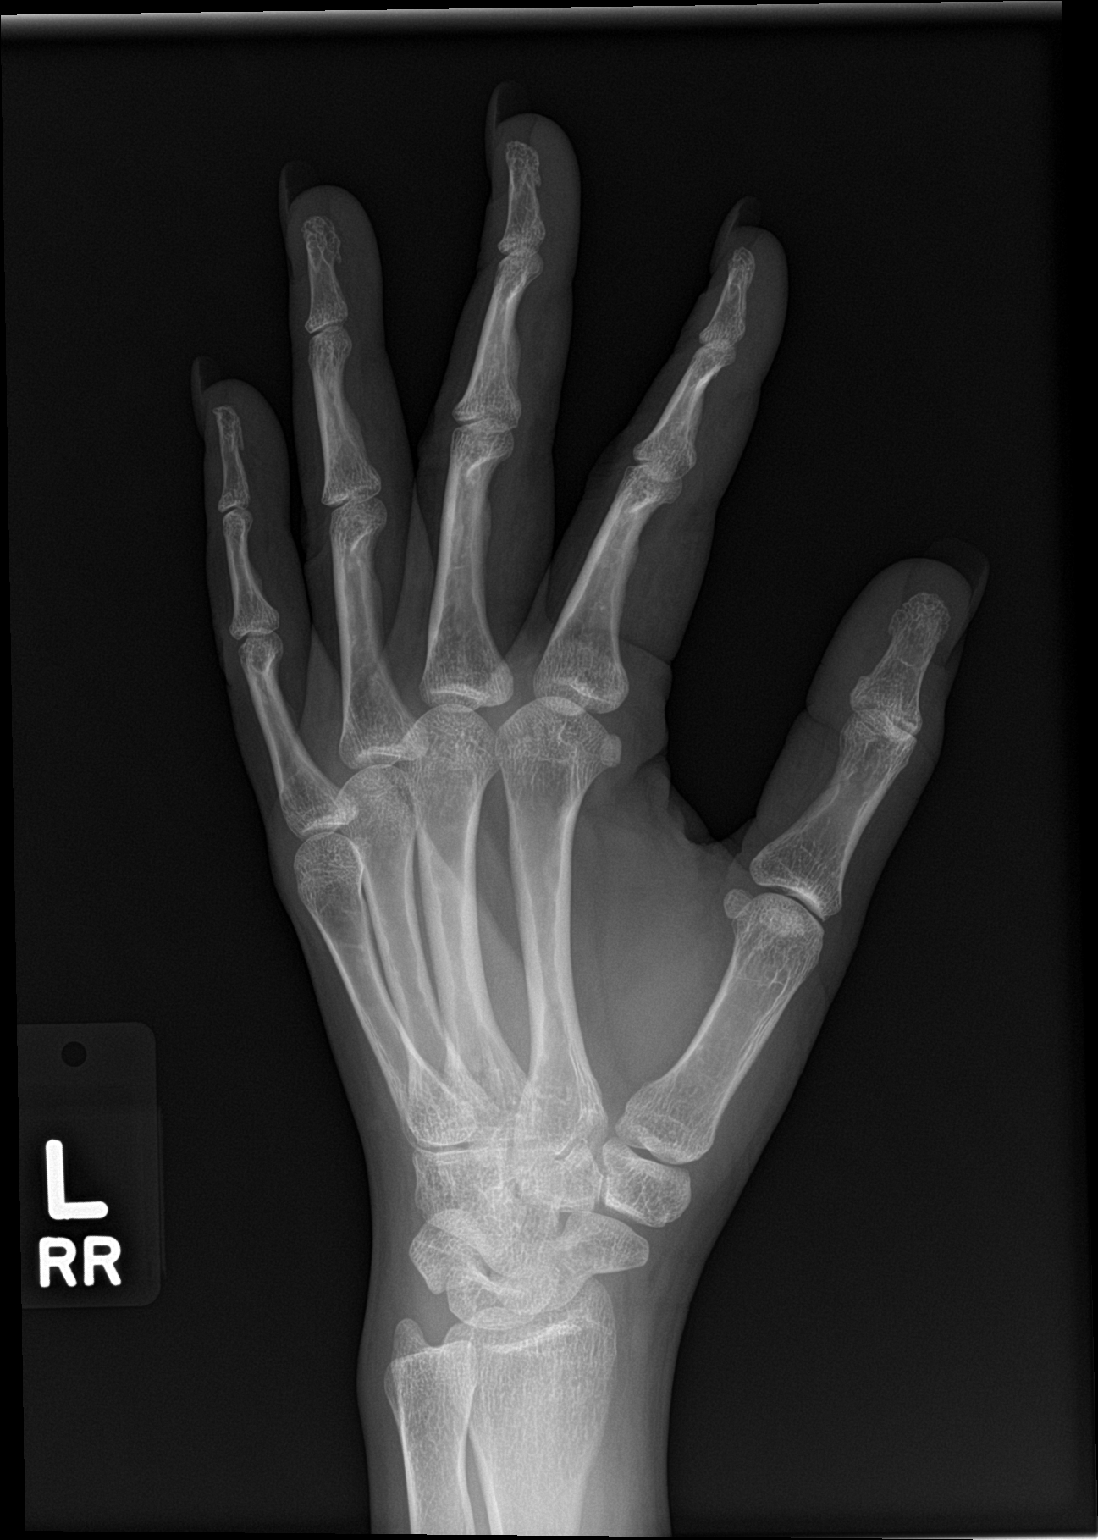

[hand lat]
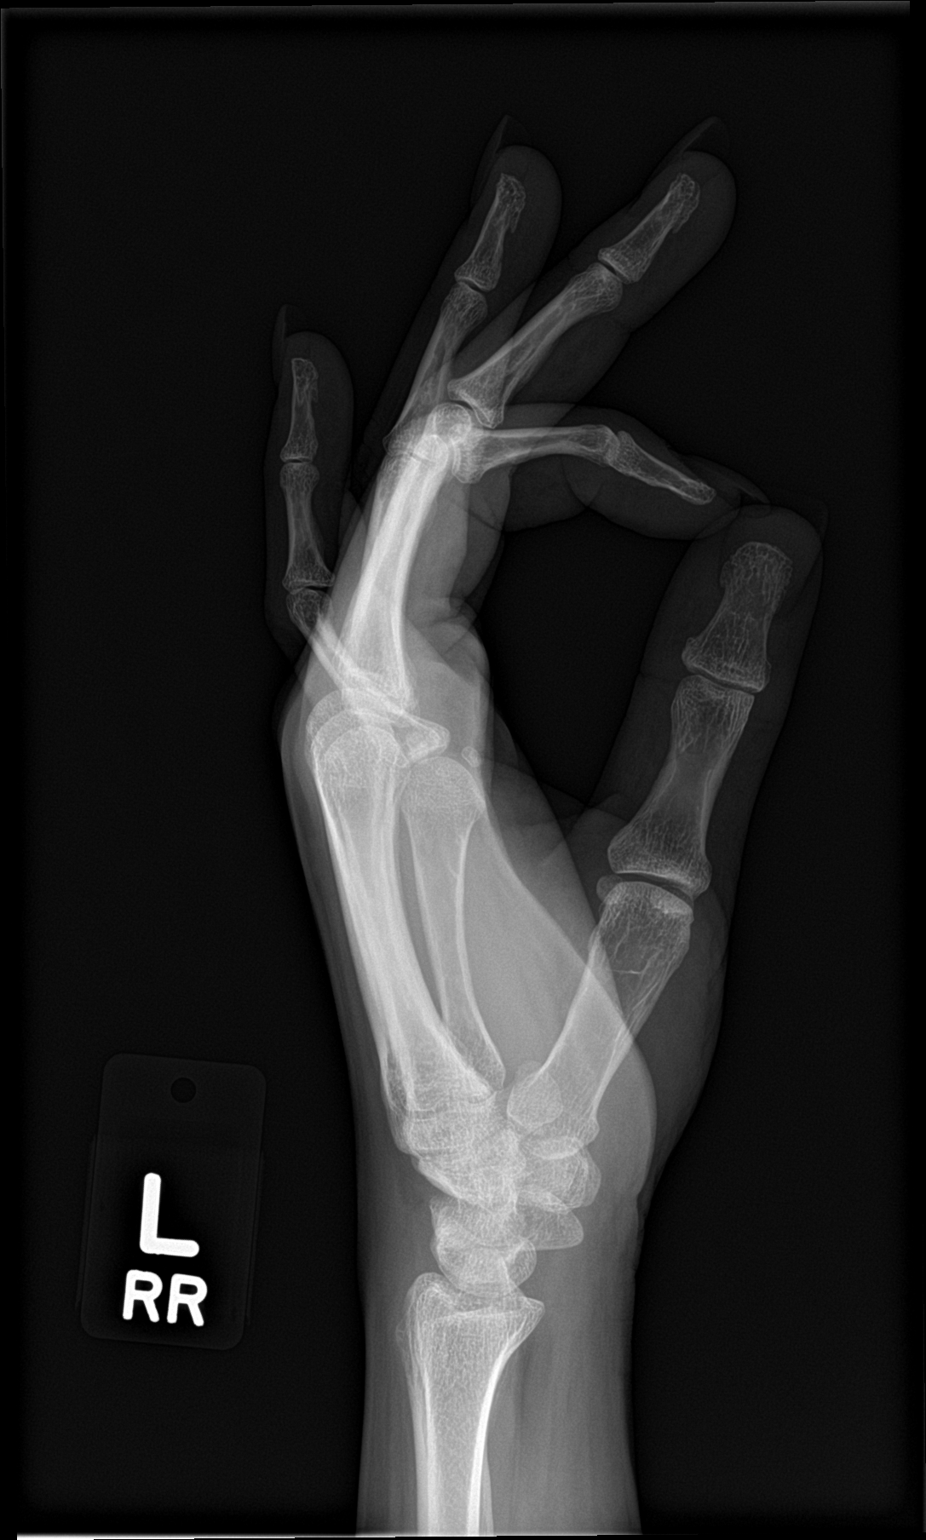

[3 of 3 positions shown; findings below may reference images not displayed]

FINDINGS: There is no evidence of fracture or dislocation. There is no
evidence of arthropathy or other focal bone abnormality. Soft
tissues are unremarkable.
IMPRESSION: Negative.

## 2022-12-25 NOTE — Progress Notes (Deleted)
FOLLOW UP Date of Service/Encounter:  12/25/22   Subjective:  Shannon Tucker (DOB: 1985/07/09) is a 38 y.o. female who [facility] in re-evaluation of the following: asthma, allergic rhinitis, keratosis pilaris History obtained from: chart review and {Persons; PED relatives w/patient:[id number]::"patient"}.   For Review, LV was on 10/16/22  with Dr.Rocio Roam seen for intial visit for asthma, allergic rhinitis, keratosis pilaris . See below for summary of history and diagnostics.  Therapeutic plans/changes recommended: start symbicort 80 mcg 2 puffs BID, PRN: INCS, astelin, 2nd generation AH, lacyhdrin ----------------------------------------------------- Pertinent History/Diagnostics:  Asthma: Childhood asthma, worsened after COVID 19 infection, Hospitalized in childhood but not as an adult. 3 rounds OCS in past year. No smoking history. Sx: chest tightness and cough. Vaccines UTD. Triggers: irritants. 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 Worse during change of weather. No prior ENT surgeries. - SPT environmental panel (10/16/22): positive to weed pollen, tree pollen, intradermals positive to indoor molds (aspergillus and penicillium) and cockroach  Other: kertasosis pilaris, red rashes on arms with eating, no specific triggers; most common food allergy panel negative 10/16/22 --------------------------------------------------- Today presents for follow-up. ***  Allergies as of 12/28/2022   No Known Allergies      Medication List        Accurate as of December 25, 2022  5:14 PM. If you have any questions, ask your nurse or doctor.          aerochamber plus with mask inhaler Use with inhaler   albuterol 108 (90 Base) MCG/ACT inhaler Commonly known as:  VENTOLIN HFA Inhale 2 puffs into the lungs every 6 (six) hours as needed for wheezing or shortness of breath.   azelastine 0.1 % nasal spray Commonly known as: ASTELIN Place 2 sprays into both nostrils 2 (two) times daily as needed for rhinitis. Use in each nostril as directed   benzonatate 200 MG capsule Commonly known as: TESSALON Take 1 capsule (200 mg total) by mouth 2 (two) times daily as needed for cough.   budesonide-formoterol 80-4.5 MCG/ACT inhaler Commonly known as: Symbicort Inhale 2 puffs into the lungs in the morning and at bedtime.   cetirizine 10 MG tablet Commonly known as: ZyrTEC Allergy Take 1 tablet (10 mg total) by mouth daily.   fluticasone 50 MCG/ACT nasal spray Commonly known as: FLONASE SPRAY 2 SPRAYS INTO EACH NOSTRIL EVERY DAY   guaiFENesin 600 MG 12 hr tablet Commonly known as: Mucinex Take 2 tablets (1,200 mg total) by mouth 2 (two) times daily.   meloxicam 7.5 MG tablet Commonly known as: MOBIC TAKE 1 TABLET(7.5 MG) BY MOUTH DAILY       Past Medical History:  Diagnosis Date   Anemia    Asthma    as a child, no prob as adult, no inhalers   Ovarian cyst    Past Surgical History:  Procedure Laterality Date   CESAREAN SECTION  2013, 2016   x 2   LAPAROSCOPIC OVARIAN CYSTECTOMY Right 01/29/2017   Procedure: LAPAROSCOPIC REMOVAL RIGHT DERMOID CYST;  Surgeon: Allie Bossier, MD;  Location: [facility];  Service: Gynecology;  Laterality: Right;   SHOULDER SURGERY Right    TUBAL LIGATION     Otherwise, there have been no changes to her past medical history, surgical history, family history, or social history.  ROS: All others negative except  as noted per HPI.   Objective:  There were no vitals taken for this visit. There is no height or weight on file to calculate BMI. Physical Exam: General Appearance:  Alert, cooperative, no distress, appears stated age  Head:  Normocephalic, without obvious abnormality, atraumatic  Eyes:  Conjunctiva clear,  EOM's intact  Nose: Nares normal, {Blank multiple:19196:a:"***","hypertrophic turbinates","normal mucosa","no visible anterior polyps","septum midline"}  Throat: Lips, tongue normal; teeth and gums normal, {Blank multiple:19196:a:"***","normal posterior oropharynx","tonsils 2+","tonsils 3+","no tonsillar exudate","+ cobblestoning","surgically absent tonsils"}  Neck: Supple, symmetrical  Lungs:   {Blank multiple:19196:a:"***","clear to auscultation bilaterally","end-expiratory wheezing","wheezing throughout"}, Respirations unlabored, {Blank multiple:19196:a:"***","no coughing","intermittent dry coughing"}  Heart:  {Blank multiple:19196:a:"***","regular rate and rhythm","no murmur"}, Appears well perfused  Extremities: No edema  Skin: {Blank multiple:19196:a:"***","Skin color, texture, turgor normal","no rashes or lesions on visualized portions of skin"}  Neurologic: No gross deficits   Labs: ***  Spirometry:  Tracings reviewed. Her effort: {Blank single:19197::"Good reproducible efforts.","It was hard to get consistent efforts and there is a question as to whether this reflects a maximal maneuver.","Poor effort, data can not be interpreted.","Variable effort-results affected","effort okay for first attempt at spirometry.","Results not reproducible due to ***"} FVC: ***L FEV1: ***L, ***% predicted FEV1/FVC ratio: ***% Interpretation: {Blank single:19197::"Spirometry consistent with mild obstructive disease","Spirometry consistent with moderate obstructive disease","Spirometry consistent with severe obstructive disease","Spirometry consistent with possible restrictive disease","Spirometry consistent with mixed obstructive and restrictive disease","Spirometry uninterpretable due to technique","Spirometry consistent with normal pattern","No overt abnormalities noted given today's efforts","Nonobstructive ratio, low FEV1","Nonobstructive ratio, low FEV1, possible restriction"}.  Please see scanned  spirometry results for details.  Skin Testing: {Blank single:19197::"Select foods","Environmental allergy panel","Environmental allergy panel and select foods","Food allergy panel","None","Deferred due to recent antihistamines use","deferred due to recent reaction","Pediatric Environmental Allergy Panel","Pediatric Food Panel","Select foods and environmental allergies"}. {Blank single:19197::"Adequate positive and negative controls","Inadequate positive control-testing invalid","Adequate positive and negative controls, dermatographism present, testing difficult to interpret"}. Results discussed with patient/family.   {Blank single:19197::"Allergy testing results were read and interpreted by myself, documented by clinical staff.","Allergy testing results were read by ***,FNP, documented by clinical staff"}  Assessment/Plan   ***  Tonny Bollman, MD  [facility]

## 2022-12-28 ENCOUNTER — Ambulatory Visit: Payer: 59 | Admitting: Internal Medicine

## 2023-01-01 DIAGNOSIS — Z419 Encounter for procedure for purposes other than remedying health state, unspecified: Secondary | ICD-10-CM | POA: Diagnosis not present

## 2023-02-01 DIAGNOSIS — Z419 Encounter for procedure for purposes other than remedying health state, unspecified: Secondary | ICD-10-CM | POA: Diagnosis not present

## 2023-02-25 ENCOUNTER — Other Ambulatory Visit: Payer: Self-pay | Admitting: Medical

## 2023-03-04 DIAGNOSIS — Z419 Encounter for procedure for purposes other than remedying health state, unspecified: Secondary | ICD-10-CM | POA: Diagnosis not present

## 2023-03-12 ENCOUNTER — Encounter: Payer: Self-pay | Admitting: Obstetrics and Gynecology

## 2023-03-12 ENCOUNTER — Ambulatory Visit (INDEPENDENT_AMBULATORY_CARE_PROVIDER_SITE_OTHER): Payer: 59 | Admitting: Obstetrics and Gynecology

## 2023-03-12 ENCOUNTER — Other Ambulatory Visit (HOSPITAL_COMMUNITY)
Admission: RE | Admit: 2023-03-12 | Discharge: 2023-03-12 | Disposition: A | Discharge status: Home / Self Care | Payer: 59 | Source: Ambulatory Visit | Attending: Obstetrics and Gynecology | Admitting: Obstetrics and Gynecology

## 2023-03-12 VITALS — BP 112/83 | HR 75 | Ht 64.0 in | Wt 155.0 lb

## 2023-03-12 DIAGNOSIS — N920 Excessive and frequent menstruation with regular cycle: Secondary | ICD-10-CM | POA: Diagnosis not present

## 2023-03-12 DIAGNOSIS — Z113 Encounter for screening for infections with a predominantly sexual mode of transmission: Secondary | ICD-10-CM | POA: Insufficient documentation

## 2023-03-12 DIAGNOSIS — Z01419 Encounter for gynecological examination (general) (routine) without abnormal findings: Secondary | ICD-10-CM

## 2023-03-12 MED ORDER — TRANEXAMIC ACID 650 MG PO TABS
1300.0000 mg | ORAL_TABLET | Freq: Three times a day (TID) | ORAL | 2 refills | Status: AC
Start: 2023-03-12 — End: ?

## 2023-03-12 NOTE — Progress Notes (Signed)
ANNUAL EXAM Patient name: Shannon Tucker MRN [id number]  Date of birth: [birth year] Chief Complaint:   Gynecologic Exam  History of Present Illness:   Shannon Tucker is a 37 y.o. [phone] female being seen today for a routine annual exam.   Current concerns: Previously with irregular cycles. CBC and TSH wnl. Tried OCPs but stopped them. She stopped them 3 months ago. Cycles have been regular but heavy now for 2-3 days. Uses tampons and pads. Changes them Q3 hours. Period lasts 7 days. Feels fatigue by the end.   Current birth control: BTL  Patient's last menstrual period was 02/10/2023 (exact date).  Last Pap/Pap History: 06/2021. Results were: NILM w/ HRHPV negative. H/O abnormal pap: no   Health Maintenance Due  Topic Date Due   HIV Screening  Never done   Hepatitis C Screening  Never done   DTaP/Tdap/Td (1 - Tdap) Never done   INFLUENZA VACCINE  02/01/2023   COVID-19 Vaccine (3 - 2023-24 season) 03/04/2023    Review of Systems:   Pertinent items are noted in HPI Denies any headaches, blurred vision, fatigue, shortness of breath, chest pain, abdominal pain, abnormal vaginal discharge/itching/odor/irritation, bowel movements, urination, or intercourse unless otherwise stated above.  Pertinent History Reviewed:  Reviewed past medical,surgical, social and family history.  Reviewed problem list, medications and allergies. Physical Assessment:   Vitals:   03/12/23 1016  BP: 112/83  Pulse: 75  Weight: 155 lb (70.3 kg)  Height: 5\' 4"  (1.626 m)  Body mass index is 26.61 kg/m.   Physical Examination:  General appearance - well appearing, and in no distress Mental status - alert, oriented to person, place, and time Psych:  She has a normal mood and affect Skin - warm and dry, normal color, no suspicious lesions noted Chest - effort normal Heart - normal rate  Breasts - breasts appear normal, no suspicious masses, no skin or nipple changes or axillary nodes Abdomen - soft,  nontender, nondistended, no masses or organomegaly Pelvic -  VULVA: normal appearing vulva with no masses, tenderness or lesions  VAGINA: normal appearing vagina with normal color and discharge, no lesions  CERVIX: normal appearing cervix without discharge or lesions, no CMT UTERUS: uterus is felt to be normal size, shape, consistency and nontender  ADNEXA: No adnexal masses or tenderness noted. Extremities:  No swelling or varicosities noted  Chaperone present for exam  No results found for this or any previous visit (from the past 24 hour(s)).  Assessment & Plan:  Angi was seen today for gynecologic exam.  Diagnoses and all orders for this visit:  Encounter for annual routine gynecological examination - Cervical cancer screening: Discussed guidelines. Pap with HPV wnl 06/2021 - GC/CT: accepts - Birth Control: BTL - Breast Health: Encouraged self breast awareness/SBE. Teaching provided.  - F/U 12 months and prn  Menorrhagia with regular cycle Discussed could resume OCP vs TXA. Could also do other hormonal birth control. She would like to try TXA. If doesn't work or starts bleeding heavy despite it, I would recommend TVUS.  -     tranexamic acid (LYSTEDA) 650 MG TABS tablet; Take 2 tablets (1,300 mg total) by mouth 3 (three) times daily. Take during menses for a maximum of five days    No orders of the defined types were placed in this encounter.   Meds:  Meds ordered this encounter  Medications   tranexamic acid (LYSTEDA) 650 MG TABS tablet    Sig: Take 2 tablets (1,300 mg total) by  mouth 3 (three) times daily. Take during menses for a maximum of five days    Dispense:  30 tablet    Refill:  2    Follow-up: Return in about 1 year (around 03/11/2024) for annual.  Milas Hock, MD 03/12/2023 10:31 AM

## 2023-03-12 NOTE — Addendum Note (Signed)
Addended by: Anell Barr on: 03/12/2023 10:33 AM   Modules accepted: Orders

## 2023-03-13 LAB — GC/CHLAMYDIA PROBE AMP (~~LOC~~) NOT AT ARMC
Chlamydia: NEGATIVE
Comment: NEGATIVE
Comment: NORMAL
Neisseria Gonorrhea: NEGATIVE

## 2023-04-03 DIAGNOSIS — Z419 Encounter for procedure for purposes other than remedying health state, unspecified: Secondary | ICD-10-CM | POA: Diagnosis not present

## 2023-04-11 ENCOUNTER — Ambulatory Visit: Payer: 59

## 2023-04-27 ENCOUNTER — Ambulatory Visit: Payer: 59 | Admitting: Medical

## 2023-04-30 ENCOUNTER — Ambulatory Visit (HOSPITAL_BASED_OUTPATIENT_CLINIC_OR_DEPARTMENT_OTHER)
Admission: RE | Admit: 2023-04-30 | Discharge: 2023-04-30 | Disposition: A | Discharge status: Home / Self Care | Payer: 59 | Source: Ambulatory Visit | Attending: Medical | Admitting: Medical

## 2023-04-30 ENCOUNTER — Ambulatory Visit: Payer: 59 | Admitting: Medical

## 2023-04-30 ENCOUNTER — Ambulatory Visit (INDEPENDENT_AMBULATORY_CARE_PROVIDER_SITE_OTHER): Payer: 59 | Admitting: Medical

## 2023-04-30 ENCOUNTER — Telehealth: Payer: Self-pay | Admitting: Medical

## 2023-04-30 ENCOUNTER — Encounter: Payer: Self-pay | Admitting: Medical

## 2023-04-30 VITALS — BP 120/81 | HR 84 | Temp 97.8°F | Ht 64.0 in | Wt 158.5 lb

## 2023-04-30 DIAGNOSIS — Z113 Encounter for screening for infections with a predominantly sexual mode of transmission: Secondary | ICD-10-CM

## 2023-04-30 DIAGNOSIS — R5383 Other fatigue: Secondary | ICD-10-CM

## 2023-04-30 DIAGNOSIS — J4520 Mild intermittent asthma, uncomplicated: Secondary | ICD-10-CM | POA: Insufficient documentation

## 2023-04-30 DIAGNOSIS — R739 Hyperglycemia, unspecified: Secondary | ICD-10-CM | POA: Diagnosis not present

## 2023-04-30 DIAGNOSIS — J45909 Unspecified asthma, uncomplicated: Secondary | ICD-10-CM | POA: Diagnosis not present

## 2023-04-30 DIAGNOSIS — Z01818 Encounter for other preprocedural examination: Secondary | ICD-10-CM

## 2023-04-30 LAB — CBC WITH DIFFERENTIAL/PLATELET
Basophils Absolute: 0.1 10*3/uL (ref 0.0–0.1)
Basophils Relative: 0.6 % (ref 0.0–3.0)
Eosinophils Absolute: 0.2 10*3/uL (ref 0.0–0.7)
Eosinophils Relative: 1.8 % (ref 0.0–5.0)
HCT: 42.2 % (ref 36.0–46.0)
Hemoglobin: 13.5 g/dL (ref 12.0–15.0)
Lymphocytes Relative: 27.4 % (ref 12.0–46.0)
Lymphs Abs: 2.4 10*3/uL (ref 0.7–4.0)
MCHC: 32 g/dL (ref 30.0–36.0)
MCV: 91.3 fL (ref 78.0–100.0)
Monocytes Absolute: 0.6 10*3/uL (ref 0.1–1.0)
Monocytes Relative: 6.4 % (ref 3.0–12.0)
Neutro Abs: 5.6 10*3/uL (ref 1.4–7.7)
Neutrophils Relative %: 63.8 % (ref 43.0–77.0)
Platelets: 227 10*3/uL (ref 150.0–400.0)
RBC: 4.62 Mil/uL (ref 3.87–5.11)
RDW: 13.5 % (ref 11.5–15.5)
WBC: 8.8 10*3/uL (ref 4.0–10.5)

## 2023-04-30 LAB — COMPREHENSIVE METABOLIC PANEL
ALT: 28 U/L (ref 0–35)
AST: 18 U/L (ref 0–37)
Albumin: 4.4 g/dL (ref 3.5–5.2)
Alkaline Phosphatase: 57 U/L (ref 39–117)
BUN: 11 mg/dL (ref 6–23)
CO2: 26 meq/L (ref 19–32)
Calcium: 9.7 mg/dL (ref 8.4–10.5)
Chloride: 102 meq/L (ref 96–112)
Creatinine, Ser: 0.51 mg/dL (ref 0.40–1.20)
GFR: 120.41 mL/min (ref >60.00)
Glucose, Bld: 96 mg/dL (ref 70–99)
Potassium: 3.9 meq/L (ref 3.5–5.1)
Sodium: 136 meq/L (ref 135–145)
Total Bilirubin: 0.4 mg/dL (ref 0.2–1.2)
Total Protein: 7.4 g/dL (ref 6.0–8.3)

## 2023-04-30 LAB — T4, FREE: Free T4: 0.65 ng/dL (ref 0.60–1.60)

## 2023-04-30 LAB — TSH: TSH: 0.73 u[IU]/mL (ref 0.35–5.50)

## 2023-04-30 LAB — APTT: aPTT: 33.1 s (ref 25.4–36.8)

## 2023-04-30 LAB — HEMOGLOBIN A1C: Hgb A1c MFr Bld: 5.6 % (ref 4.6–6.5)

## 2023-04-30 NOTE — Telephone Encounter (Signed)
[facility] request to get preop breast US prior to reduction. Want to order and breast center. Can you reach out to them and make sure I ordered the right [place]. Never ordered for this reason

## 2023-04-30 NOTE — Patient Instructions (Addendum)
1. Preop examination -per surgeon in [facility] preop sheet. - EKG 12-Lead(normal sinus rhythm) - Comp Met (CMET) - CBC w/Diff - Protime-INR; Future - PTT - hCG, serum, qualitative - DG Chest 2 View; Future - US BREAST BILATERAL(this is on preop list for breast reduction. I have placed order and getting staff to make sure this is correct [place] order for preop breast reduction procedure)  2. Elevated blood sugar -mild in past - Hemoglobin A1c  3. Fatigue, unspecified type -rare occasional fatigue and this lab is on prep op lab list. - TSH - T3 Uptake - T4, free  4. Screen for STD (sexually transmitted disease) -on lab screening list - HIV Antibody (routine testing w rflx)  5. Mild intermittent asthma without complication -stable/controlled not needing meds.  Holding form until all studies are back.  Follow up date to be determined.

## 2023-04-30 NOTE — Progress Notes (Signed)
Subjective:    Patient ID: Shannon Tucker, female    DOB: 07-11-1985, 37 y.o.   MRN: [id number]  HPI  Pt in for preop. Breast reducation and liposuction. Surgery will be done in [place] on May 11, 2023. She will be in [place] fro 2 weeks after surgery.  Pt has asthma. Controlled and not on any medications/nhaler presently. Also allergic rhinitis but controlled presently as well.   Pt had general anesthesia before without any complications. Tummy tuck and 2 sections per pt.  Lmp- begninning of October. Hx of tubal ligation.   Review of Systems  Constitutional:  Negative for chills, fatigue and fever.  Respiratory:  Negative for cough, chest tightness, shortness of breath and wheezing.   Cardiovascular:  Negative for chest pain and palpitations.  Gastrointestinal:  Negative for abdominal pain, blood in stool, constipation and diarrhea.  Skin:  Negative for pallor and rash.  Neurological:  Negative for dizziness, seizures and light-headedness.  Hematological:  Negative for adenopathy. Does not bruise/bleed easily.  Psychiatric/Behavioral:  Negative for behavioral problems and confusion.     Past Medical History:  Diagnosis Date   Anemia    Asthma    as a child, no prob as adult, no inhalers   Ovarian cyst      Social History   Socioeconomic History   Marital status: Single    Spouse name: Not on file   Number of children: Not on file   Years of education: Not on file   Highest education level: Not on file  Occupational History   Not on file  Tobacco Use   Smoking status: Never   Smokeless tobacco: Never  Vaping Use   Vaping status: Never Used  Substance and Sexual Activity   Alcohol use: Yes    Alcohol/week: 2.0 standard drinks of alcohol    Types: 2 Glasses of wine per week   Drug use: No   Sexual activity: Yes    Birth control/protection: Surgical  Other Topics Concern   Not on file  Social History Narrative   Not on file   Social Determinants of Health    Financial Resource Strain: Not on file  Food Insecurity: Not on file  Transportation Needs: Not on file  Physical Activity: Not on file  Stress: Not on file  Social Connections: Unknown (11/15/2021)   Received from [facility]   Social Network    Social Network: Not on file  Intimate Partner Violence: Unknown (10/06/2021)   Received from [facility], [facility]   HITS    Physically Hurt: Not on file    Insult or Talk Down To: Not on file    Threaten Physical Harm: Not on file    Scream or Curse: Not on file    Past Surgical History:  Procedure Laterality Date   CESAREAN SECTION  2013, 2016   x 2   LAPAROSCOPIC OVARIAN CYSTECTOMY Right 01/29/2017   Procedure: LAPAROSCOPIC REMOVAL RIGHT DERMOID CYST;  Surgeon: Allie Bossier, MD;  Location: [facility];  Service: Gynecology;  Laterality: Right;   SHOULDER SURGERY Right    TUBAL LIGATION      Family History  Problem Relation Age of Onset   Asthma Child    Asthma Child     No Known Allergies  Current Outpatient Medications on File Prior to Visit  Medication Sig Dispense Refill   albuterol (VENTOLIN HFA) 108 (90 Base) MCG/ACT inhaler Inhale 2 puffs into the lungs every 6 (six) hours as  needed for wheezing or shortness of breath. 8 g 2   azelastine (ASTELIN) 0.1 % nasal spray Place 2 sprays into both nostrils 2 (two) times daily as needed for rhinitis. Use in each nostril as directed 30 mL 5   benzonatate (TESSALON) 200 MG capsule Take 1 capsule (200 mg total) by mouth 2 (two) times daily as needed for cough. 20 capsule 0   budesonide-formoterol (SYMBICORT) 80-4.5 MCG/ACT inhaler Inhale 2 puffs into the lungs in the morning and at bedtime. 1 each 3   cetirizine (ZYRTEC ALLERGY) 10 MG tablet Take 1 tablet (10 mg total) by mouth daily. 30 tablet 3   fluticasone (FLONASE) 50 MCG/ACT nasal spray SPRAY 2 SPRAYS INTO EACH NOSTRIL EVERY DAY 48 mL 1   guaiFENesin (MUCINEX) 600 MG 12 hr tablet Take 2 tablets (1,200 mg  total) by mouth 2 (two) times daily. 30 tablet 0   meloxicam (MOBIC) 7.5 MG tablet TAKE 1 TABLET(7.5 MG) BY MOUTH DAILY 30 tablet 0   Spacer/Aero-Holding Chambers (AEROCHAMBER PLUS WITH MASK) inhaler Use with inhaler 1 each 1   tranexamic acid (LYSTEDA) 650 MG TABS tablet Take 2 tablets (1,300 mg total) by mouth 3 (three) times daily. Take during menses for a maximum of five days 30 tablet 2   No current facility-administered medications on file prior to visit.    BP 120/81   Pulse 84   Temp 97.8 F (36.6 C) (Oral)   Ht 5\' 4"  (1.626 m)   Wt 158 lb 8 oz (71.9 kg)   SpO2 100%   BMI 27.21 kg/m        Objective:   Physical Exam  General Mental Status- Alert. General Appearance- Not in acute distress.   Skin General: Color- Normal Color. Moisture- Normal Moisture.  Neck Carotid Arteries- Normal color. Moisture- Normal Moisture. No carotid bruits. No JVD.  Chest and Lung Exam Auscultation: Breath Sounds:-Normal.  Cardiovascular Auscultation:Rythm- Regular. Murmurs & Other Heart Sounds:Auscultation of the heart reveals- No Murmurs.  Abdomen Inspection:-Inspeection Normal. Palpation/Percussion:Note:No mass. Palpation and Percussion of the abdomen reveal- Non Tender, Non Distended + BS, no rebound or guarding.   Neurologic Cranial Nerve exam:- CN III-XII intact(No nystagmus), symmetric smile. Strength:- 5/5 equal and symmetric strength both upper and lower extremities.   Lower ext- no pedal edema. Calves symmetric.    Assessment & Plan:   Patient Instructions  1. Preop examination -per surgeon in [facility] preop sheet. - EKG 12-Lead(normal sinus rhythm) - Comp Met (CMET) - CBC w/Diff - Protime-INR; Future - PTT - hCG, serum, qualitative - DG Chest 2 View; Future - US BREAST BILATERAL(this is on preop list for breast reduction. I have placed order and getting staff to make sure this is correct [place] order for preop breast reduction procedure)  2. Elevated blood  sugar -mild in past - Hemoglobin A1c  3. Fatigue, unspecified type -rare occasional fatigue and this lab is on prep op lab list. - TSH - T3 Uptake - T4, free  4. Screen for STD (sexually transmitted disease) -on lab screening list - HIV Antibody (routine testing w rflx)  5. Mild intermittent asthma without complication -stable/controlled not needing meds.  Holding form until all studies are back.  Follow up date to be determined.    Esperanza Richters, PA-C

## 2023-05-01 LAB — T3 UPTAKE: T3 Uptake: 27 % (ref 22–35)

## 2023-05-01 LAB — HIV ANTIBODY (ROUTINE TESTING W REFLEX): HIV 1&2 Ab, 4th Generation: NONREACTIVE

## 2023-05-01 LAB — HCG, SERUM, QUALITATIVE: Preg, Serum: NEGATIVE

## 2023-05-01 NOTE — Telephone Encounter (Signed)
Left message with plastic surgeons office to clarify what breast ultrasound they would like done.

## 2023-05-03 ENCOUNTER — Encounter: Payer: Self-pay | Admitting: Medical

## 2023-05-03 ENCOUNTER — Other Ambulatory Visit: Payer: Self-pay | Admitting: Medical

## 2023-05-03 ENCOUNTER — Other Ambulatory Visit: Payer: 59

## 2023-05-03 DIAGNOSIS — Z1231 Encounter for screening mammogram for malignant neoplasm of breast: Secondary | ICD-10-CM

## 2023-05-03 DIAGNOSIS — Z01818 Encounter for other preprocedural examination: Secondary | ICD-10-CM

## 2023-05-03 NOTE — Telephone Encounter (Signed)
Spoke with Shannon Tucker at the [facility] and she stated that with breast reduction they usually do a screening mammogram.  She went ahead and put in the order and sent for you to cosign.  They will call her to schedule.

## 2023-05-03 NOTE — Progress Notes (Signed)
Redraw, no charge today. 

## 2023-05-04 ENCOUNTER — Encounter: Payer: Self-pay | Admitting: Medical

## 2023-05-04 DIAGNOSIS — Z419 Encounter for procedure for purposes other than remedying health state, unspecified: Secondary | ICD-10-CM | POA: Diagnosis not present

## 2023-05-04 LAB — PROTIME-INR
INR: 1 {ratio} (ref 0.8–1.0)
Prothrombin Time: 10.8 s (ref 9.6–13.1)

## 2023-05-07 ENCOUNTER — Ambulatory Visit: Payer: 59

## 2023-05-07 NOTE — Telephone Encounter (Signed)
Pt needs copy of her pt, inr and EKG. She is going to get breast imagine studies in [place] per her report. Please make copy of forms filled out and place to scan.

## 2023-05-29 ENCOUNTER — Telehealth: Payer: 59 | Admitting: Medical

## 2023-05-29 ENCOUNTER — Other Ambulatory Visit (INDEPENDENT_AMBULATORY_CARE_PROVIDER_SITE_OTHER): Payer: 59

## 2023-05-29 DIAGNOSIS — K219 Gastro-esophageal reflux disease without esophagitis: Secondary | ICD-10-CM

## 2023-05-29 DIAGNOSIS — D649 Anemia, unspecified: Secondary | ICD-10-CM

## 2023-05-29 DIAGNOSIS — K921 Melena: Secondary | ICD-10-CM

## 2023-05-29 DIAGNOSIS — R112 Nausea with vomiting, unspecified: Secondary | ICD-10-CM | POA: Diagnosis not present

## 2023-05-29 LAB — COMPREHENSIVE METABOLIC PANEL
ALT: 45 U/L — ABNORMAL HIGH (ref 0–35)
AST: 20 U/L (ref 0–37)
Albumin: 4.1 g/dL (ref 3.5–5.2)
Alkaline Phosphatase: 51 U/L (ref 39–117)
BUN: 10 mg/dL (ref 6–23)
CO2: 26 meq/L (ref 19–32)
Calcium: 9 mg/dL (ref 8.4–10.5)
Chloride: 104 meq/L (ref 96–112)
Creatinine, Ser: 0.6 mg/dL (ref 0.40–1.20)
GFR: 115.72 mL/min (ref >60.00)
Glucose, Bld: 113 mg/dL — ABNORMAL HIGH (ref 70–99)
Potassium: 4 meq/L (ref 3.5–5.1)
Sodium: 137 meq/L (ref 135–145)
Total Bilirubin: 0.4 mg/dL (ref 0.2–1.2)
Total Protein: 6.8 g/dL (ref 6.0–8.3)

## 2023-05-29 LAB — CBC WITH DIFFERENTIAL/PLATELET
Basophils Absolute: 0.1 10*3/uL (ref 0.0–0.1)
Basophils Relative: 0.7 % (ref 0.0–3.0)
Eosinophils Absolute: 0.3 10*3/uL (ref 0.0–0.7)
Eosinophils Relative: 3.3 % (ref 0.0–5.0)
HCT: 29.6 % — ABNORMAL LOW (ref 36.0–46.0)
Hemoglobin: 9.6 g/dL — ABNORMAL LOW (ref 12.0–15.0)
Lymphocytes Relative: 30 % (ref 12.0–46.0)
Lymphs Abs: 2.4 10*3/uL (ref 0.7–4.0)
MCHC: 32.5 g/dL (ref 30.0–36.0)
MCV: 87.5 fL (ref 78.0–100.0)
Monocytes Absolute: 0.6 10*3/uL (ref 0.1–1.0)
Monocytes Relative: 7.8 % (ref 3.0–12.0)
Neutro Abs: 4.7 10*3/uL (ref 1.4–7.7)
Neutrophils Relative %: 58.2 % (ref 43.0–77.0)
Platelets: 379 10*3/uL (ref 150.0–400.0)
RBC: 3.38 Mil/uL — ABNORMAL LOW (ref 3.87–5.11)
RDW: 14.3 % (ref 11.5–15.5)
WBC: 8.1 10*3/uL (ref 4.0–10.5)

## 2023-05-29 LAB — LIPASE: Lipase: 29 U/L (ref 11.0–59.0)

## 2023-05-29 MED ORDER — OMEPRAZOLE 40 MG PO CPDR: 40.0000 mg | DELAYED_RELEASE_CAPSULE | Freq: Every day | ORAL | 3 refills | Status: DC

## 2023-05-29 MED ORDER — FAMOTIDINE 20 MG PO TABS: 20.0000 mg | ORAL_TABLET | Freq: Every day | ORAL | 0 refills | Status: DC

## 2023-05-29 MED ORDER — ONDANSETRON HCL 4 MG PO TABS: 4.0000 mg | ORAL_TABLET | Freq: Three times a day (TID) | ORAL | 0 refills | Status: DC | PRN

## 2023-05-29 NOTE — Patient Instructions (Addendum)
Gastroesophageal Reflux Disease (GERD) Reports of vomiting, epigastric pain, and acid reflux. Self-medicated with Omeprazole 40mg  with symptomatic relief. -Continue Omeprazole 40mg  daily. -Add Famotidine 20mg  daily. -Advise on dietary modifications:avoid fatty, fried foods, caffeine, alcohol, and chocolate. -Order blood tests to check white blood cell count, liver enzymes, and pancreatic enzymes.  Dehydration Risk History of vomiting and difficulty maintaining fluid intake. -Prescribe Zofran for nausea and vomiting. -Advise on hydration with Pedialyte, sugar-free Gatorade, or Propel to prevent dehydration.  Follow-up -Review blood test results when available. -If symptoms worsen or do not improve, advise patient to seek emergency care due to short clinic week for Thanksgiving.  Video visit but instructed to come to office today to get cbc, cmp and lipase.

## 2023-05-29 NOTE — Addendum Note (Signed)
Addended by: Gwenevere Abbot on: 05/29/2023 10:34 PM   Modules accepted: Orders

## 2023-05-29 NOTE — Addendum Note (Signed)
Addended by: Gwenevere Abbot on: 05/29/2023 10:33 PM   Modules accepted: Orders

## 2023-05-29 NOTE — Addendum Note (Signed)
Addended by: Mervin Kung A on: 05/29/2023 02:23 PM   Modules accepted: Orders

## 2023-05-29 NOTE — Progress Notes (Signed)
Subjective:    Patient ID: Shannon Tucker, female    DOB: 06/29/1986, 37 y.o.   MRN: [id number]  HPI  Virtual Visit via Video Note  I connected with Shannon Tucker on 05/29/23 at  1:20 PM EST by a video enabled telemedicine application and verified that I am speaking with the correct person using two identifiers.  Location: Patient: home([place]) Provider: office   I discussed the limitations of evaluation and management by telemedicine and the availability of in person appointments. The patient expressed understanding and agreed to proceed.  History of Present Illness: Discussed the use of AI scribe software for clinical note transcription with the patient, who gave verbal consent to proceed.  History of Present Illness   The patient, with a recent history of breast surgery, began experiencing vomiting and abdominal pain since last Friday. Surgery was 2 weeks ago and we both agree seems bit remote to cause current symptoms.    The pain, described as a burning sensation, is located at top of the stomach and radiates/burns upwards. Describe reflux like sympotms with belching. The patient vomited once on Friday, six times on Saturday, and twice on Sunday. She managed to control the vomiting by resting and taking rehydration salts. However, she struggled to keep down fluids, including watermelon juice and Gatorade, which she attempted to consume. She denied any recent alcohol or caffeine consumption.  The patient managed to eat food on Monday and took an Omeprazole pill, which seemed to alleviate the symptoms. She took another Omeprazole pill the following morning and was able to eat. She also reported frequent belching. The patient has been taking 40mg  Omeprazole, borrowed from a relative.  The patient's last menstrual period was on the first of November. She denied having diarrhea or constipation, although she reported one episode of loose stool during the period of vomiting. She also denied  any recent pregnancy, as she had a negative pregnancy test before her surgery in [place].  The patient noted that she had been experiencing these symptoms for a while, particularly after consuming caffeine or beer. However, the symptoms became more severe after her recent surgery.        Observations/Objective: General-no acute distress, pleasant, oriented. Lungs- on inspection lungs appear unlabored. Neck- no tracheal deviation or jvd on inspection. Neuro- gross motor function appears intact.   Assessment and Plan: Assessment and Plan    Gastroesophageal Reflux Disease (GERD) Reports of vomiting, epigastric pain, and acid reflux. Self-medicated with Omeprazole 40mg  with symptomatic relief. -Continue Omeprazole 40mg  daily. -Add Famotidine 20mg  daily. -Advise on dietary modifications:avoid fatty, fried foods, caffeine, alcohol, and chocolate. -Order blood tests to check white blood cell count, liver enzymes, and pancreatic enzymes.  Dehydration Risk History of vomiting and difficulty maintaining fluid intake. -Prescribe Zofran for nausea and vomiting. -Advise on hydration with Pedialyte, sugar-free Gatorade, or Propel to prevent dehydration.  Follow-up -Review blood test results when available. -If symptoms worsen or do not improve, advise patient to seek emergency care due to short clinic week for Thanksgiving.       Video visit but instructed to come to office today to get cbc, cmp and lipase.  Follow Up Instructions:    I discussed the assessment and treatment plan with the patient. The patient was provided an opportunity to ask questions and all were answered. The patient agreed with the plan and demonstrated an understanding of the instructions.   The patient was advised to call back or seek an in-person evaluation if the symptoms  worsen or if the condition fails to improve as anticipated.     Esperanza Richters, PA-C    Review of Systems     Objective:   Physical  Exam        Assessment & Plan:

## 2023-05-30 ENCOUNTER — Telehealth: Payer: Self-pay | Admitting: *Deleted

## 2023-05-30 ENCOUNTER — Other Ambulatory Visit (INDEPENDENT_AMBULATORY_CARE_PROVIDER_SITE_OTHER): Payer: 59

## 2023-05-30 ENCOUNTER — Other Ambulatory Visit: Payer: 59

## 2023-05-30 DIAGNOSIS — D649 Anemia, unspecified: Secondary | ICD-10-CM | POA: Diagnosis not present

## 2023-05-30 LAB — IBC + FERRITIN
Ferritin: 28 ng/mL (ref 10.0–291.0)
Iron: 20 ug/dL — ABNORMAL LOW (ref 42–145)
Saturation Ratios: 4.7 % — ABNORMAL LOW (ref 20.0–50.0)
TIBC: 424.2 ug/dL (ref 250.0–450.0)
Transferrin: 303 mg/dL (ref 212.0–360.0)

## 2023-05-30 MED ORDER — OMEPRAZOLE 40 MG PO CPDR: 40.0000 mg | DELAYED_RELEASE_CAPSULE | Freq: Every day | ORAL | 3 refills | Status: DC

## 2023-05-30 MED ORDER — ONDANSETRON HCL 4 MG PO TABS: 4.0000 mg | ORAL_TABLET | Freq: Three times a day (TID) | ORAL | 0 refills | Status: DC | PRN

## 2023-05-30 MED ORDER — FAMOTIDINE 20 MG PO TABS: 20.0000 mg | ORAL_TABLET | Freq: Every day | ORAL | 0 refills | Status: DC

## 2023-05-30 NOTE — Telephone Encounter (Signed)
Notified pt of 11/26 lab result note.  She requested that we cancel RXs sent to [facility] and send them to [facility] on [address].  Spoke with pharmacist at [facility] and cancelled Omeprazole 40mg , famotidine 20mg  and ondansetron 4mg .  Rxs sent to [facility].

## 2023-05-30 NOTE — Addendum Note (Signed)
Addended by: Mervin Kung A on: 05/30/2023 08:19 AM   Modules accepted: Orders

## 2023-06-01 MED ORDER — IRON (FERROUS SULFATE) 325 (65 FE) MG PO TABS
ORAL_TABLET | ORAL | 2 refills | Status: AC
Start: 2023-06-01 — End: ?

## 2023-06-01 NOTE — Addendum Note (Signed)
Addended by: Gwenevere Abbot on: 06/01/2023 08:06 AM   Modules accepted: Orders

## 2023-06-03 DIAGNOSIS — Z419 Encounter for procedure for purposes other than remedying health state, unspecified: Secondary | ICD-10-CM | POA: Diagnosis not present

## 2023-06-04 ENCOUNTER — Other Ambulatory Visit (INDEPENDENT_AMBULATORY_CARE_PROVIDER_SITE_OTHER): Payer: 59

## 2023-06-04 DIAGNOSIS — D649 Anemia, unspecified: Secondary | ICD-10-CM

## 2023-06-04 LAB — CBC WITH DIFFERENTIAL/PLATELET
Basophils Absolute: 0 10*3/uL (ref 0.0–0.1)
Basophils Relative: 0.6 % (ref 0.0–3.0)
Eosinophils Absolute: 0.2 10*3/uL (ref 0.0–0.7)
Eosinophils Relative: 2.9 % (ref 0.0–5.0)
HCT: 32.7 % — ABNORMAL LOW (ref 36.0–46.0)
Hemoglobin: 10.5 g/dL — ABNORMAL LOW (ref 12.0–15.0)
Lymphocytes Relative: 32 % (ref 12.0–46.0)
Lymphs Abs: 2.7 10*3/uL (ref 0.7–4.0)
MCHC: 32.1 g/dL (ref 30.0–36.0)
MCV: 86 fL (ref 78.0–100.0)
Monocytes Absolute: 0.8 10*3/uL (ref 0.1–1.0)
Monocytes Relative: 10 % (ref 3.0–12.0)
Neutro Abs: 4.5 10*3/uL (ref 1.4–7.7)
Neutrophils Relative %: 54.5 % (ref 43.0–77.0)
Platelets: 326 10*3/uL (ref 150.0–400.0)
RBC: 3.8 Mil/uL — ABNORMAL LOW (ref 3.87–5.11)
RDW: 15.2 % (ref 11.5–15.5)
WBC: 8.3 10*3/uL (ref 4.0–10.5)

## 2023-06-05 ENCOUNTER — Telehealth: Payer: Self-pay

## 2023-06-05 LAB — FECAL OCCULT BLOOD, IMMUNOCHEMICAL: Fecal Occult Bld: POSITIVE — AB

## 2023-06-05 NOTE — Telephone Encounter (Signed)
CRITICAL VALUE STICKER  CRITICAL VALUE: Positive iFob  RECEIVER (on-site recipient of call): Melton Alar, RMA  DATE & TIME NOTIFIED:  06/05/23, 8:18 AM  MESSENGER (representative from lab): Lawson Fiscal, lab  MD NOTIFIED: Esperanza Richters- PCP  TIME OF NOTIFICATION: 06/05/23, 8:18 AM  RESPONSE:  Pending

## 2023-06-05 NOTE — Addendum Note (Signed)
Addended by: Gwenevere Abbot on: 06/05/2023 07:34 PM   Modules accepted: Orders

## 2023-06-07 ENCOUNTER — Ambulatory Visit (INDEPENDENT_AMBULATORY_CARE_PROVIDER_SITE_OTHER): Payer: 59 | Admitting: Medical

## 2023-06-07 ENCOUNTER — Encounter: Payer: Self-pay | Admitting: Physician Assistant

## 2023-06-07 VITALS — BP 121/83 | HR 81 | Temp 98.1°F | Resp 16 | Ht 64.0 in | Wt 156.0 lb

## 2023-06-07 DIAGNOSIS — K921 Melena: Secondary | ICD-10-CM | POA: Diagnosis not present

## 2023-06-07 DIAGNOSIS — D649 Anemia, unspecified: Secondary | ICD-10-CM

## 2023-06-07 DIAGNOSIS — R5383 Other fatigue: Secondary | ICD-10-CM | POA: Diagnosis not present

## 2023-06-07 LAB — CBC WITH DIFFERENTIAL/PLATELET
Basophils Absolute: 0 10*3/uL (ref 0.0–0.1)
Basophils Relative: 0.5 % (ref 0.0–3.0)
Eosinophils Absolute: 0.2 10*3/uL (ref 0.0–0.7)
Eosinophils Relative: 2.6 % (ref 0.0–5.0)
HCT: 31.3 % — ABNORMAL LOW (ref 36.0–46.0)
Hemoglobin: 10.3 g/dL — ABNORMAL LOW (ref 12.0–15.0)
Lymphocytes Relative: 32.1 % (ref 12.0–46.0)
Lymphs Abs: 2.2 10*3/uL (ref 0.7–4.0)
MCHC: 32.8 g/dL (ref 30.0–36.0)
MCV: 85.2 fL (ref 78.0–100.0)
Monocytes Absolute: 0.6 10*3/uL (ref 0.1–1.0)
Monocytes Relative: 9.2 % (ref 3.0–12.0)
Neutro Abs: 3.8 10*3/uL (ref 1.4–7.7)
Neutrophils Relative %: 55.6 % (ref 43.0–77.0)
Platelets: 253 10*3/uL (ref 150.0–400.0)
RBC: 3.68 Mil/uL — ABNORMAL LOW (ref 3.87–5.11)
RDW: 14.9 % (ref 11.5–15.5)
WBC: 6.8 10*3/uL (ref 4.0–10.5)

## 2023-06-07 NOTE — Progress Notes (Signed)
   Subjective:    Patient ID: Shannon Tucker, female    DOB: Mar 03, 1986, 37 y.o.   MRN: [id number]  HPI Discussed the use of AI scribe software for clinical note transcription with the patient, who gave verbal consent to proceed.  History of Present Illness   The patient, with a history of gastrointestinal issues, reports an improvement in her condition. She has been taking prescribed medications, specifically Omeprazole and Famotidine, for stomach discomfort and has not needed to take Zofran for vomiting as the symptom has resolved. She denies any current episodes of diarrhea.  However, she expresses feeling fatigued, particularly when walking, likening her energy levels to those of a much older individual. She notes a slight improvement in energy levels but still feels generally tired.  The patient has been diagnosed with anemia, with a hemoglobin level of 9.6 and hematocrit of 29.6 a week ago, which slightly increased to 10.5 and 32.7 respectively three days ago. She has been taking iron supplements and has resumed a normal diet after a period of poor appetite and vomiting.  A stool test revealed the presence of blood, raising concerns about potential gastrointestinal bleeding.        Review of Systems See hpi    Objective:   Physical Exam  General Mental Status- Alert. General Appearance- Not in acute distress.   Skin General: Color- Normal Color. Moisture- Normal Moisture.  Neck No JVD.  Chest and Lung Exam Auscultation: Breath Sounds:-CTA  Cardiovascular Auscultation:Rythm- RRR Murmurs & Other Heart Sounds:Auscultation of the heart reveals- No Murmurs.  Abdomen Inspection:-Inspeection Normal. Palpation/Percussion:Note:No mass. Palpation and Percussion of the abdomen reveal- Non Tender, Non Distended + BS, no rebound or guarding.   Neurologic Cranial Nerve exam:- CN III-XII intact(No nystagmus), symmetric smile. Strength:- 5/5 equal and symmetric strength both  upper and lower extremities.       Assessment & Plan:  Assessment and Plan    Anemia(new and ocurred post surgery which was done on 05-11-23) Hemoglobin and hematocrit levels were low (9.6 and 29.6 respectively) but have shown slight improvement (10.5 and 32.7 respectively). Positive fecal occult blood test raises concern for gastrointestinal bleeding as a potential cause of anemia. Patient is currently taking iron supplements and has improved dietary intake. -Repeat CBC to monitor hemoglobin and hematocrit levels. -Continue iron supplementation. -Refer to gastroenterology for further evaluation of potential gastrointestinal bleeding. Patient to call gastroenterology office directly to schedule appointment.  Gastrointestinal symptoms Patient reports improvement in abdomen pain with use of Omeprazole and Famotidine. No current need for Zofran. -Continue Omeprazole and Famotidine. -Monitor for recurrence of gastrointestinal symptoms.  Fatigue Patient reports persistent fatigue, though slightly improved. This may be related to anemia. -Monitor fatigue levels closely. If symptoms worsen, patient to return for further evaluation.  Follow-up in three weeks, or sooner if symptoms worsen.   Esperanza Richters, PA-C

## 2023-06-07 NOTE — Addendum Note (Signed)
Addended by: Gwenevere Abbot on: 06/07/2023 09:40 AM   Modules accepted: Orders

## 2023-06-07 NOTE — Patient Instructions (Signed)
Anemia(new and ocurred post surgery which was done on 05-11-23) Hemoglobin and hematocrit levels were low (9.6 and 29.6 respectively) but have shown slight improvement (10.5 and 32.7 respectively). Positive fecal occult blood test raises concern for gastrointestinal bleeding as a potential cause of anemia. Patient is currently taking iron supplements and has improved dietary intake. -Repeat CBC to monitor hemoglobin and hematocrit levels. -Continue iron supplementation. -Refer to gastroenterology for further evaluation of potential gastrointestinal bleeding. Patient to call gastroenterology office directly to schedule appointment.  Gastrointestinal symptoms Patient reports improvement in abdomen pain with use of Omeprazole and Famotidine. No current need for Zofran. -Continue Omeprazole and Famotidine. -Monitor for recurrence of gastrointestinal symptoms.  Fatigue Patient reports persistent fatigue, though slightly improved. This may be related to anemia. -Monitor fatigue levels closely. If symptoms worsen, patient to return for further evaluation.  Follow-up in three weeks, or sooner if symptoms worsen.

## 2023-06-08 ENCOUNTER — Other Ambulatory Visit: Payer: Self-pay

## 2023-06-08 DIAGNOSIS — D509 Iron deficiency anemia, unspecified: Secondary | ICD-10-CM

## 2023-06-15 ENCOUNTER — Other Ambulatory Visit (INDEPENDENT_AMBULATORY_CARE_PROVIDER_SITE_OTHER): Payer: 59

## 2023-06-15 ENCOUNTER — Other Ambulatory Visit: Payer: 59

## 2023-06-15 DIAGNOSIS — D509 Iron deficiency anemia, unspecified: Secondary | ICD-10-CM | POA: Diagnosis not present

## 2023-06-15 LAB — CBC WITH DIFFERENTIAL/PLATELET
Basophils Absolute: 0 10*3/uL (ref 0.0–0.1)
Basophils Relative: 0.6 % (ref 0.0–3.0)
Eosinophils Absolute: 0.2 10*3/uL (ref 0.0–0.7)
Eosinophils Relative: 2.5 % (ref 0.0–5.0)
HCT: 35.9 % — ABNORMAL LOW (ref 36.0–46.0)
Hemoglobin: 11.3 g/dL — ABNORMAL LOW (ref 12.0–15.0)
Lymphocytes Relative: 31.6 % (ref 12.0–46.0)
Lymphs Abs: 2.1 10*3/uL (ref 0.7–4.0)
MCHC: 31.5 g/dL (ref 30.0–36.0)
MCV: 84.4 fL (ref 78.0–100.0)
Monocytes Absolute: 0.5 10*3/uL (ref 0.1–1.0)
Monocytes Relative: 7.3 % (ref 3.0–12.0)
Neutro Abs: 3.9 10*3/uL (ref 1.4–7.7)
Neutrophils Relative %: 58 % (ref 43.0–77.0)
Platelets: 246 10*3/uL (ref 150.0–400.0)
RBC: 4.25 Mil/uL (ref 3.87–5.11)
RDW: 15.4 % (ref 11.5–15.5)
WBC: 6.6 10*3/uL (ref 4.0–10.5)

## 2023-06-29 ENCOUNTER — Other Ambulatory Visit: Payer: Self-pay | Admitting: Medical

## 2023-07-04 DIAGNOSIS — Z419 Encounter for procedure for purposes other than remedying health state, unspecified: Secondary | ICD-10-CM | POA: Diagnosis not present

## 2023-08-04 DIAGNOSIS — Z419 Encounter for procedure for purposes other than remedying health state, unspecified: Secondary | ICD-10-CM | POA: Diagnosis not present

## 2023-08-21 ENCOUNTER — Other Ambulatory Visit (INDEPENDENT_AMBULATORY_CARE_PROVIDER_SITE_OTHER): Payer: Medicaid Other

## 2023-08-21 ENCOUNTER — Encounter: Payer: Self-pay | Admitting: Physician Assistant

## 2023-08-21 ENCOUNTER — Ambulatory Visit (INDEPENDENT_AMBULATORY_CARE_PROVIDER_SITE_OTHER): Payer: Medicaid Other | Admitting: Physician Assistant

## 2023-08-21 VITALS — BP 106/66 | HR 65 | Ht 65.0 in | Wt 156.0 lb

## 2023-08-21 DIAGNOSIS — D509 Iron deficiency anemia, unspecified: Secondary | ICD-10-CM | POA: Diagnosis not present

## 2023-08-21 DIAGNOSIS — R112 Nausea with vomiting, unspecified: Secondary | ICD-10-CM

## 2023-08-21 DIAGNOSIS — K219 Gastro-esophageal reflux disease without esophagitis: Secondary | ICD-10-CM

## 2023-08-21 LAB — IBC + FERRITIN
Ferritin: 17.6 ng/mL (ref 10.0–291.0)
Iron: 89 ug/dL (ref 42–145)
Saturation Ratios: 25.5 % (ref 20.0–50.0)
TIBC: 348.6 ug/dL (ref 250.0–450.0)
Transferrin: 249 mg/dL (ref 212.0–360.0)

## 2023-08-21 MED ORDER — OMEPRAZOLE 40 MG PO CPDR: 40.0000 mg | DELAYED_RELEASE_CAPSULE | Freq: Two times a day (BID) | ORAL | 5 refills | Status: DC

## 2023-08-21 NOTE — Progress Notes (Signed)
Chief Complaint: Abdominal pain and nausea  HPI:    Shannon Tucker is a 38 year old female with a past medical history as listed below, who was referred to me by Esperanza Richters, PA-C for a complaint of abdominal pain and nausea.      05/29/2023 patient seen via telemedicine for vomiting abdominal pain.  Recently had breast surgery 2 weeks prior.  At that time Omeprazole 40 mg daily was helping.  Famotidine 20 mg was added.    05/30/2023 iron studies with an iron low at 20 and a percent saturation low at 4.7.  Patient started on iron supplement.    06/04/2023 fecal occult blood negative.    06/15/2023 CBC normal.    Today, patient presents to clinic and tells me back in November she was having issues with nausea and vomiting and epigastric pain.  They started her on Omeprazole 40 mg daily and Famotidine 20 mg twice a day and symptoms are much better.  She has had no further vomiting issues and the pain is gone but she still has problems if she eats something that is oily or eats too much and if she does that then she will have reflux and acid taste in her mouth for 2 to 3 days before it stops again.    Also tells me she is iron deficient, apparently was started on oral iron tabs but had to stop these because they were increasing her reflux and heartburn symptoms.  Does remain fatigued.    Originally from [place].    Denies fever, chills, weight loss or blood in her stool.  Past Medical History:  Diagnosis Date   Anemia    Asthma    as a child, no prob as adult, no inhalers   Ovarian cyst     Past Surgical History:  Procedure Laterality Date   CESAREAN SECTION  2013, 2016   x 2   LAPAROSCOPIC OVARIAN CYSTECTOMY Right 01/29/2017   Procedure: LAPAROSCOPIC REMOVAL RIGHT DERMOID CYST;  Surgeon: Allie Bossier, MD;  Location: [facility];  Service: Gynecology;  Laterality: Right;   SHOULDER SURGERY Right    TUBAL LIGATION      Current Outpatient Medications  Medication Sig Dispense Refill    albuterol (VENTOLIN HFA) 108 (90 Base) MCG/ACT inhaler Inhale 2 puffs into the lungs every 6 (six) hours as needed for wheezing or shortness of breath. 8 g 2   azelastine (ASTELIN) 0.1 % nasal spray Place 2 sprays into both nostrils 2 (two) times daily as needed for rhinitis. Use in each nostril as directed 30 mL 5   benzonatate (TESSALON) 200 MG capsule Take 1 capsule (200 mg total) by mouth 2 (two) times daily as needed for cough. 20 capsule 0   budesonide-formoterol (SYMBICORT) 80-4.5 MCG/ACT inhaler Inhale 2 puffs into the lungs in the morning and at bedtime. 1 each 3   cetirizine (ZYRTEC ALLERGY) 10 MG tablet Take 1 tablet (10 mg total) by mouth daily. 30 tablet 3   famotidine (PEPCID) 20 MG tablet TAKE 1 TABLET BY MOUTH EVERY DAY 90 tablet 1   fluticasone (FLONASE) 50 MCG/ACT nasal spray SPRAY 2 SPRAYS INTO EACH NOSTRIL EVERY DAY 48 mL 1   guaiFENesin (MUCINEX) 600 MG 12 hr tablet Take 2 tablets (1,200 mg total) by mouth 2 (two) times daily. 30 tablet 0   Iron, Ferrous Sulfate, 325 (65 Fe) MG TABS 1 tab po bid 60 tablet 2   meloxicam (MOBIC) 7.5 MG tablet TAKE 1 TABLET(7.5 MG) BY  MOUTH DAILY 30 tablet 0   omeprazole (PRILOSEC) 40 MG capsule Take 1 capsule (40 mg total) by mouth daily. 30 capsule 3   ondansetron (ZOFRAN) 4 MG tablet Take 1 tablet (4 mg total) by mouth every 8 (eight) hours as needed for nausea or vomiting. 20 tablet 0   Spacer/Aero-Holding Chambers (AEROCHAMBER PLUS WITH MASK) inhaler Use with inhaler 1 each 1   tranexamic acid (LYSTEDA) 650 MG TABS tablet Take 2 tablets (1,300 mg total) by mouth 3 (three) times daily. Take during menses for a maximum of five days 30 tablet 2   No current facility-administered medications for this visit.    Allergies as of 08/21/2023   (No Known Allergies)    Family History  Problem Relation Age of Onset   Asthma Child    Asthma Child     Social History   Socioeconomic History   Marital status: Single    Spouse name: Not on file    Number of children: Not on file   Years of education: Not on file   Highest education level: Not on file  Occupational History   Not on file  Tobacco Use   Smoking status: Never   Smokeless tobacco: Never  Vaping Use   Vaping status: Never Used  Substance and Sexual Activity   Alcohol use: Yes    Alcohol/week: 2.0 standard drinks of alcohol    Types: 2 Glasses of wine per week   Drug use: No   Sexual activity: Yes    Birth control/protection: Surgical  Other Topics Concern   Not on file  Social History Narrative   Not on file   Social Drivers of Health   Financial Resource Strain: Not on file  Food Insecurity: Not on file  Transportation Needs: Not on file  Physical Activity: Not on file  Stress: Not on file  Social Connections: Unknown (11/15/2021)   Received from [facility]   Social Network    Social Network: Not on file  Intimate Partner Violence: Unknown (10/06/2021)   Received from [employer], [facility]   HITS    Physically Hurt: Not on file    Insult or Talk Down To: Not on file    Threaten Physical Harm: Not on file    Scream or Curse: Not on file    Review of Systems:    Constitutional: No weight loss, fever or chills Skin: No rash Cardiovascular: No chest pain Respiratory: No SOB  Gastrointestinal: See HPI and otherwise negative Genitourinary: No dysuria Neurological: No headache, dizziness or syncope Musculoskeletal: No new muscle or joint pain Hematologic: No bleeding  Psychiatric: No history of depression or anxiety   Physical Exam:  Vital signs: BP 106/66   Pulse 65   Ht 5\' 5"  (1.651 m)   Wt 156 lb (70.8 kg)   BMI 25.96 kg/m    Constitutional:   Pleasant female appears to be in NAD, Well developed, Well nourished, alert and cooperative Head:  Normocephalic and atraumatic. Eyes:   PEERL, EOMI. No icterus. Conjunctiva pink. Ears:  Normal auditory acuity. Neck:  Supple Throat: Oral cavity and pharynx without  inflammation, swelling or lesion.  Respiratory: Respirations even and unlabored. Lungs clear to auscultation bilaterally.   No wheezes, crackles, or rhonchi.  Cardiovascular: Normal S1, S2. No MRG. Regular rate and rhythm. No peripheral edema, cyanosis or pallor.  Gastrointestinal:  Soft, nondistended, nontender. No rebound or guarding. Normal bowel sounds. No appreciable masses or hepatomegaly. Rectal:  Not performed.  Msk:  Symmetrical without gross deformities. Without edema, no deformity or joint abnormality.  Neurologic:  Alert and  oriented x4;  grossly normal neurologically.  Skin:   Dry and intact without significant lesions or rashes. Psychiatric: Demonstrates good judgement and reason without abnormal affect or behaviors.  RELEVANT LABS AND IMAGING: CBC    Component Value Date/Time   WBC 6.6 06/15/2023 0857   RBC 4.25 06/15/2023 0857   HGB 11.3 (L) 06/15/2023 0857   HGB 12.5 06/09/2021 1404   HCT 35.9 (L) 06/15/2023 0857   HCT 37.3 06/09/2021 1404   PLT 246.0 06/15/2023 0857   PLT 226 06/09/2021 1404   MCV 84.4 06/15/2023 0857   MCV 89 06/09/2021 1404   MCH 29.8 06/09/2021 1404   MCH 29.2 01/25/2017 1507   MCHC 31.5 06/15/2023 0857   RDW 15.4 06/15/2023 0857   RDW 13.0 06/09/2021 1404   LYMPHSABS 2.1 06/15/2023 0857   MONOABS 0.5 06/15/2023 0857   EOSABS 0.2 06/15/2023 0857   BASOSABS 0.0 06/15/2023 0857    CMP     Component Value Date/Time   NA 137 05/29/2023 1423   K 4.0 05/29/2023 1423   CL 104 05/29/2023 1423   CO2 26 05/29/2023 1423   GLUCOSE 113 (H) 05/29/2023 1423   BUN 10 05/29/2023 1423   CREATININE 0.60 05/29/2023 1423   CALCIUM 9.0 05/29/2023 1423   PROT 6.8 05/29/2023 1423   ALBUMIN 4.1 05/29/2023 1423   AST 20 05/29/2023 1423   ALT 45 (H) 05/29/2023 1423   ALKPHOS 51 05/29/2023 1423   BILITOT 0.4 05/29/2023 1423   GFRNONAA >60 12/17/2016 0355   GFRAA >60 12/17/2016 0355    Assessment: 1.  GERD: Symptoms are better on Omeprazole 40 mg  daily and Famotidine 20 twice daily over the past 2 months, still with occasional episodes of reflux that can last 2 to 3 days if she eats the wrong thing; likely baseline gastritis 2.  Nausea and vomiting: With above, much improved on a PPI 3.  Iron deficiency anemia: Found by PCP, hemoglobin normal, cannot take oral iron pills as this upsets her stomach  Plan: 1.  Increased Omeprazole to 40 mg twice daily, 30-60 minutes before breakfast and dinner.  #60 with 5 refills. 2.  Continue Famotidine 20 mg twice daily 3.  Recheck iron studies today with ferritin.  If this remains low and she is still anemic, then will need to discuss iron infusions.  She does tell me she has very heavy periods.  Hemoccult studies previously negative. 4.  Patient to follow in clinic with me in 3 months or sooner if necessary. 5.  Discussed an antireflux diet. 6.  Assigned to Dr. Lavon Paganini today.  Hyacinth Meeker, PA-C [facility] 08/21/2023, 9:30 AM  Cc: Esperanza Richters, PA-C

## 2023-08-21 NOTE — Patient Instructions (Signed)
We have sent the following medications to your pharmacy for you to pick up at your convenience:  Omeprazole 40mg  twice daily 30-60 minutes before breakfast and dinner  Your provider has requested that you go to the basement level for lab work before leaving today. Press "B" on the elevator. The lab is located at the first door on the left as you exit the elevator.  _______________________________________________________  If your blood pressure at your visit was 140/90 or greater, please contact your primary care physician to follow up on this.  _______________________________________________________  If you are age 19 or older, your body mass index should be between 23-30. Your Body mass index is 25.96 kg/m. If this is out of the aforementioned range listed, please consider follow up with your Primary Care Provider.  If you are age 71 or younger, your body mass index should be between 19-25. Your Body mass index is 25.96 kg/m. If this is out of the aformentioned range listed, please consider follow up with your Primary Care Provider.   ________________________________________________________  The [facility] providers would like to encourage you to use [facility] to communicate with providers for non-urgent requests or questions.  Due to long hold times on the telephone, sending your provider a message by [facility] may be a faster and more efficient way to get a response.  Please allow 48 business hours for a response.  Please remember that this is for non-urgent requests.  _______________________________________________________  Thank you for trusting me with your gastrointestinal care!   Jennifer L. [place], [place]

## 2023-09-01 DIAGNOSIS — Z419 Encounter for procedure for purposes other than remedying health state, unspecified: Secondary | ICD-10-CM | POA: Diagnosis not present

## 2023-10-13 DIAGNOSIS — Z419 Encounter for procedure for purposes other than remedying health state, unspecified: Secondary | ICD-10-CM | POA: Diagnosis not present

## 2023-11-09 ENCOUNTER — Encounter: Payer: Self-pay | Admitting: Physician Assistant

## 2023-11-09 ENCOUNTER — Ambulatory Visit (INDEPENDENT_AMBULATORY_CARE_PROVIDER_SITE_OTHER): Payer: Medicaid Other | Admitting: Physician Assistant

## 2023-11-09 VITALS — BP 104/70 | HR 70 | Ht 65.0 in | Wt 159.0 lb

## 2023-11-09 DIAGNOSIS — R1013 Epigastric pain: Secondary | ICD-10-CM

## 2023-11-09 DIAGNOSIS — K219 Gastro-esophageal reflux disease without esophagitis: Secondary | ICD-10-CM | POA: Diagnosis not present

## 2023-11-09 DIAGNOSIS — R142 Eructation: Secondary | ICD-10-CM

## 2023-11-09 NOTE — Progress Notes (Signed)
 Chief Complaint: GERD and eructations  HPI:    Shannon Tucker is a 38 year old female, assigned to Dr. Leonia Raman, with a past medical history as listed below, who was referred to me by Sylvia Everts, PA-C for a complaint of GERD and eructations.    05/29/2023 patient seen via telemedicine for vomiting abdominal pain.  Recently had breast surgery 2 weeks prior.  At that time Omeprazole  40 mg daily was helping.  Famotidine  20 mg was added.    05/30/2023 iron  studies with an iron  low at 20 and a percent saturation low at 4.7.  Patient started on iron  supplement.    06/04/2023 fecal occult blood negative.    06/15/2023 CBC normal.    08/21/2023 seen in clinic by me and at that time discussed that back in November she was having issues with nausea and vomiting epigastric pain, better after she was started on Omeprazole  40 mg daily and Famotidine  20 mg twice daily.  No further vomiting.  She had also recently been started on iron  tabs.  At that time increase Omeprazole  to 40 twice daily and continued Famotidine  20 twice daily.  We rechecked iron  studies.  She reported very heavy periods which were likely the cause.    08/21/23 iron  studies normal.    Today, the patient tells me that she is some better, her heartburn and epigastric discomfort are about 80% better with the Famotidine  20 mg nightly and Omeprazole  40 mg twice daily, but she has developed a new symptom of eructations which are bothersome.    Denies fever, chills or weight loss.  Past Medical History:  Diagnosis Date   Anemia    Asthma    as a child, no prob as adult, no inhalers   Ovarian cyst     Past Surgical History:  Procedure Laterality Date   CESAREAN SECTION  2013, 2016   x 2   LAPAROSCOPIC OVARIAN CYSTECTOMY Right 01/29/2017   Procedure: LAPAROSCOPIC REMOVAL RIGHT DERMOID CYST;  Surgeon: Ana Balling, MD;  Location: [facility];  Service: Gynecology;  Laterality: Right;   SHOULDER SURGERY Right    TUBAL LIGATION      Current  Outpatient Medications  Medication Sig Dispense Refill   cetirizine  (ZYRTEC  ALLERGY ) 10 MG tablet Take 1 tablet (10 mg total) by mouth daily. 30 tablet 3   famotidine  (PEPCID ) 20 MG tablet TAKE 1 TABLET BY MOUTH EVERY DAY 90 tablet 1   guaiFENesin  (MUCINEX ) 600 MG 12 hr tablet Take 2 tablets (1,200 mg total) by mouth 2 (two) times daily. 30 tablet 0   Iron , Ferrous Sulfate , 325 (65 Fe) MG TABS 1 tab po bid 60 tablet 2   omeprazole  (PRILOSEC) 40 MG capsule Take 1 capsule (40 mg total) by mouth in the morning and at bedtime. 60 capsule 5   tranexamic acid  (LYSTEDA ) 650 MG TABS tablet Take 2 tablets (1,300 mg total) by mouth 3 (three) times daily. Take during menses for a maximum of five days 30 tablet 2   No current facility-administered medications for this visit.    Allergies as of 11/09/2023   (No Known Allergies)    Family History  Problem Relation Age of Onset   Asthma Child    Asthma Child     Social History   Socioeconomic History   Marital status: Single    Spouse name: Not on file   Number of children: Not on file   Years of education: Not on file   Highest education level: Not  on file  Occupational History   Not on file  Tobacco Use   Smoking status: Never   Smokeless tobacco: Never  Vaping Use   Vaping status: Never Used  Substance and Sexual Activity   Alcohol use: Yes    Alcohol/week: 2.0 standard drinks of alcohol    Types: 2 Glasses of wine per week   Drug use: No   Sexual activity: Yes    Birth control/protection: Surgical  Other Topics Concern   Not on file  Social History Narrative   Not on file   Social Drivers of Health   Financial Resource Strain: Not on file  Food Insecurity: Not on file  Transportation Needs: Not on file  Physical Activity: Not on file  Stress: Not on file  Social Connections: Unknown (11/15/2021)   Received from [facility]   Social Network    Social Network: Not on file  Intimate Partner Violence:  Unknown (10/06/2021)   Received from [employer], [facility]   HITS    Physically Hurt: Not on file    Insult or Talk Down To: Not on file    Threaten Physical Harm: Not on file    Scream or Curse: Not on file    Review of Systems:    Constitutional: No weight loss, fever or chills Cardiovascular: No chest pain Respiratory: No SOB  Gastrointestinal: See HPI and otherwise negative   Physical Exam:  Vital signs: BP 104/70   Pulse 70   Ht 5\' 5"  (1.651 m)   Wt 159 lb (72.1 kg)   BMI 26.46 kg/m    Constitutional:   Pleasant female appears to be in NAD, Well developed, Well nourished, alert and cooperative Respiratory: Respirations even and unlabored. Lungs clear to auscultation bilaterally.   No wheezes, crackles, or rhonchi.  Cardiovascular: Normal S1, S2. No MRG. Regular rate and rhythm. No peripheral edema, cyanosis or pallor.  Gastrointestinal:  Soft, nondistended, nontender. No rebound or guarding. Normal bowel sounds. No appreciable masses or hepatomegaly. Rectal:  Not performed.  Psychiatric: Oriented to person, place and time. Demonstrates good judgement and reason without abnormal affect or behaviors.  RELEVANT LABS AND IMAGING: CBC    Component Value Date/Time   WBC 6.6 06/15/2023 0857   RBC 4.25 06/15/2023 0857   HGB 11.3 (L) 06/15/2023 0857   HGB 12.5 06/09/2021 1404   HCT 35.9 (L) 06/15/2023 0857   HCT 37.3 06/09/2021 1404   PLT 246.0 06/15/2023 0857   PLT 226 06/09/2021 1404   MCV 84.4 06/15/2023 0857   MCV 89 06/09/2021 1404   MCH 29.8 06/09/2021 1404   MCH 29.2 01/25/2017 1507   MCHC 31.5 06/15/2023 0857   RDW 15.4 06/15/2023 0857   RDW 13.0 06/09/2021 1404   LYMPHSABS 2.1 06/15/2023 0857   MONOABS 0.5 06/15/2023 0857   EOSABS 0.2 06/15/2023 0857   BASOSABS 0.0 06/15/2023 0857    CMP     Component Value Date/Time   NA 137 05/29/2023 1423   K 4.0 05/29/2023 1423   CL 104 05/29/2023 1423   CO2 26 05/29/2023 1423   GLUCOSE 113 (H) 05/29/2023  1423   BUN 10 05/29/2023 1423   CREATININE 0.60 05/29/2023 1423   CALCIUM 9.0 05/29/2023 1423   PROT 6.8 05/29/2023 1423   ALBUMIN 4.1 05/29/2023 1423   AST 20 05/29/2023 1423   ALT 45 (H) 05/29/2023 1423   ALKPHOS 51 05/29/2023 1423   BILITOT 0.4 05/29/2023 1423   GFRNONAA >60 12/17/2016 0355  GFRAA >60 12/17/2016 0355    Assessment: 1.  Epigastric pain/heartburn: See below 2.  GERD: 80% better with Pantoprazole 40 twice daily and Famotidine  20 nightly 3.  Eructations: Increased over the past few months, consider relation to above  Plan: 1.  Patient has been on increased PPI for a few months now and continues with eructations and some epigastric discomfort, scheduled her for diagnostic EGD in the [facility] with Dr. Nandigam.  Did provide the patient a detailed list of risk for the procedure and she agrees to proceed. Patient is appropriate for endoscopic procedure(s) in the ambulatory ([facility]) setting.  2.  Continue Omeprazole  40 twice daily and Famotidine  20 mg nightly 3.  Reviewed antireflux diet and lifestyle modifications. 4.  Pending results from EGD could consider SIBO testing in the future.  Shannon Capra, PA-C [facility] 11/09/2023, 9:27 AM  Cc: Saguier, Edward, PA-C

## 2023-11-09 NOTE — Patient Instructions (Addendum)
 Continue Omeprazole  40 mg twice daily.   Continue Famotidine  20 mg nightly.   You have been scheduled for an endoscopy. Please follow written instructions given to you at your visit today.  If you use inhalers (even only as needed), please bring them with you on the day of your procedure.  If you take any of the following medications, they will need to be adjusted prior to your procedure:   DO NOT TAKE 7 DAYS PRIOR TO TEST- Trulicity (dulaglutide) Ozempic, Wegovy (semaglutide) Mounjaro (tirzepatide) Bydureon Bcise (exanatide extended release)  DO NOT TAKE 1 DAY PRIOR TO YOUR TEST Rybelsus (semaglutide) Adlyxin (lixisenatide) Victoza (liraglutide) Byetta (exanatide) __________________________________________________________________  _______________________________________________________  If your blood pressure at your visit was 140/90 or greater, please contact your primary care physician to follow up on this.  _______________________________________________________  If you are age 37 or older, your body mass index should be between 23-30. Your Body mass index is 26.46 kg/m. If this is out of the aforementioned range listed, please consider follow up with your Primary Care Provider.  If you are age 31 or younger, your body mass index should be between 19-25. Your Body mass index is 26.46 kg/m. If this is out of the aformentioned range listed, please consider follow up with your Primary Care Provider.   ________________________________________________________  The [facility] providers would like to encourage you to use [facility] to communicate with providers for non-urgent requests or questions.  Due to long hold times on the telephone, sending your provider a message by [facility] may be a faster and more efficient way to get a response.  Please allow 48 business hours for a response.  Please remember that this is for non-urgent requests.   _______________________________________________________

## 2023-11-12 ENCOUNTER — Encounter (HOSPITAL_COMMUNITY): Payer: Self-pay

## 2023-11-12 DIAGNOSIS — Z419 Encounter for procedure for purposes other than remedying health state, unspecified: Secondary | ICD-10-CM | POA: Diagnosis not present

## 2023-11-19 ENCOUNTER — Encounter: Payer: Self-pay | Admitting: Gastroenterology

## 2023-11-19 ENCOUNTER — Ambulatory Visit: Admitting: Gastroenterology

## 2023-11-19 VITALS — BP 124/72 | HR 64 | Temp 97.9°F | Resp 16 | Ht 65.0 in | Wt 159.0 lb

## 2023-11-19 DIAGNOSIS — D509 Iron deficiency anemia, unspecified: Secondary | ICD-10-CM | POA: Diagnosis not present

## 2023-11-19 DIAGNOSIS — K2100 Gastro-esophageal reflux disease with esophagitis, without bleeding: Secondary | ICD-10-CM

## 2023-11-19 DIAGNOSIS — K449 Diaphragmatic hernia without obstruction or gangrene: Secondary | ICD-10-CM

## 2023-11-19 DIAGNOSIS — K219 Gastro-esophageal reflux disease without esophagitis: Secondary | ICD-10-CM | POA: Diagnosis not present

## 2023-11-19 DIAGNOSIS — R142 Eructation: Secondary | ICD-10-CM | POA: Diagnosis not present

## 2023-11-19 MED ORDER — ESOMEPRAZOLE MAGNESIUM 40 MG PO CPDR: 40.0000 mg | DELAYED_RELEASE_CAPSULE | Freq: Two times a day (BID) | ORAL | 3 refills | Status: DC

## 2023-11-19 MED ORDER — SODIUM CHLORIDE 0.9 % IV SOLN: 500.0000 mL | Freq: Once | INTRAVENOUS | Status: DC

## 2023-11-19 NOTE — Progress Notes (Signed)
 Report to PACU, RN, vss, BBS= Clear.

## 2023-11-19 NOTE — Op Note (Signed)
 [facility] Patient Name: Shannon Tucker Procedure Date: 11/19/2023 10:14 AM MRN: [id number] Endoscopist: Shannon Tucker , MD, [id number] Age: 38 Referring MD:  Date of Birth: October 13, 1985 Gender: Female Account #: [id number] Procedure:                Upper GI endoscopy Indications:              Epigastric abdominal pain, Heartburn, Esophageal                            reflux symptoms that persist despite appropriate                            therapy Medicines:                Monitored Anesthesia Care Procedure:                Pre-Anesthesia Assessment:                           - Prior to the procedure, a History and Physical                            was performed, and patient medications and                            allergies were reviewed. The patient's tolerance of                            previous anesthesia was also reviewed. The risks                            and benefits of the procedure and the sedation                            options and risks were discussed with the patient.                            All questions were answered, and informed consent                            was obtained. Prior Anticoagulants: The patient has                            taken no anticoagulant or antiplatelet agents. ASA                            Grade Assessment: II - A patient with mild systemic                            disease. After reviewing the risks and benefits,                            the patient was deemed in satisfactory condition to  undergo the procedure.                           After obtaining informed consent, the endoscope was                            passed under direct vision. Throughout the                            procedure, the patient's blood pressure, pulse, and                            oxygen saturations were monitored continuously. The                            GIF [id number] #[id number] was introduced  through the                            mouth, and advanced to the second part of duodenum.                            The upper GI endoscopy was accomplished without                            difficulty. The patient tolerated the procedure                            well. Scope In: Scope Out: Findings:                 LA Grade B (one or more mucosal breaks greater than                            5 mm, not extending between the tops of two mucosal                            folds) esophagitis with no bleeding was found 34 to                            38 cm from the incisors.                           A 3 cm hiatal hernia was present.                           The stomach was normal.                           The cardia and gastric fundus were normal on                            retroflexion.                           The examined duodenum was normal. Complications:  No immediate complications. Estimated Blood Loss:     Estimated blood loss was minimal. Impression:               - LA Grade B reflux esophagitis with no bleeding.                           - 3 cm hiatal hernia.                           - Normal stomach.                           - Normal examined duodenum.                           - No specimens collected. Recommendation:           - Patient has a contact number available for                            emergencies. The signs and symptoms of potential                            delayed complications were discussed with the                            patient. Return to normal activities tomorrow.                            Written discharge instructions were provided to the                            patient.                           - Resume previous diet.                           - Continue present medications.                           - Follow an antireflux regimen.                           - Use Nexium  (esomeprazole ) 40 mg PO BID. Rx for 90                             days with 3 refills                           - DC Omeprazole                            - Follow up in GI office in 3 months with Shannon Tucker or Dr.Eyanna Tucker Shannon Dandy, MD  11/19/2023 10:34:07 AM This report has been signed electronically.

## 2023-11-19 NOTE — Progress Notes (Signed)
VS by DT    

## 2023-11-19 NOTE — Patient Instructions (Signed)

## 2023-11-19 NOTE — Progress Notes (Signed)
 [facility] History and Physical   Primary Care Physician:  Sylvia Everts, PA-C   Reason for Procedure:  Iron  deficiency anemia, gerd, frequent belching, epigastric pain  Plan:    EGD and colonoscopy with possible interventions as needed     HPI: Shannon Tucker is a very pleasant 38 y.o. female here for EGD for evaluation of Iron  deficiency anemia, gerd, frequent belching and epigastric pain despite PPI. Please refer to office visit note by [name] for additional details  The risks and benefits as well as alternatives of endoscopic procedure(s) have been discussed and reviewed. All questions answered. The patient agrees to proceed.    Past Medical History:  Diagnosis Date   Allergy     Anemia    Asthma    as a child, no prob as adult, no inhalers   Ovarian cyst     Past Surgical History:  Procedure Laterality Date   CESAREAN SECTION  2013, 2016   x 2   LAPAROSCOPIC OVARIAN CYSTECTOMY Right 01/29/2017   Procedure: LAPAROSCOPIC REMOVAL RIGHT DERMOID CYST;  Surgeon: Ana Balling, MD;  Location: [facility];  Service: Gynecology;  Laterality: Right;   SHOULDER SURGERY Right    TUBAL LIGATION      Prior to Admission medications   Medication Sig Start Date End Date Taking? Authorizing Provider  famotidine  (PEPCID ) 20 MG tablet TAKE 1 TABLET BY MOUTH EVERY DAY 06/29/23  Yes Saguier, Gaylin Ke, PA-C  omeprazole  (PRILOSEC) 40 MG capsule Take 1 capsule (40 mg total) by mouth in the morning and at bedtime. 08/21/23  Yes Graciella Lavender, PA  cetirizine  (ZYRTEC  ALLERGY ) 10 MG tablet Take 1 tablet (10 mg total) by mouth daily. 10/16/22   Sean Czar, MD  guaiFENesin  (MUCINEX ) 600 MG 12 hr tablet Take 2 tablets (1,200 mg total) by mouth 2 (two) times daily. 09/14/22   Everlina Hock, NP  Iron , Ferrous Sulfate , 325 (65 Fe) MG TABS 1 tab po bid 06/01/23   Saguier, Gaylin Ke, PA-C  tranexamic acid  (LYSTEDA ) 650 MG TABS tablet Take 2 tablets (1,300 mg total) by mouth 3  (three) times daily. Take during menses for a maximum of five days 03/12/23   Lacey Pian, MD    Current Outpatient Medications  Medication Sig Dispense Refill   famotidine  (PEPCID ) 20 MG tablet TAKE 1 TABLET BY MOUTH EVERY DAY 90 tablet 1   omeprazole  (PRILOSEC) 40 MG capsule Take 1 capsule (40 mg total) by mouth in the morning and at bedtime. 60 capsule 5   cetirizine  (ZYRTEC  ALLERGY ) 10 MG tablet Take 1 tablet (10 mg total) by mouth daily. 30 tablet 3   guaiFENesin  (MUCINEX ) 600 MG 12 hr tablet Take 2 tablets (1,200 mg total) by mouth 2 (two) times daily. 30 tablet 0   Iron , Ferrous Sulfate , 325 (65 Fe) MG TABS 1 tab po bid 60 tablet 2   tranexamic acid  (LYSTEDA ) 650 MG TABS tablet Take 2 tablets (1,300 mg total) by mouth 3 (three) times daily. Take during menses for a maximum of five days 30 tablet 2   Current Facility-Administered Medications  Medication Dose Route Frequency Provider Last Rate Last Admin   0.9 %  sodium chloride  infusion  500 mL Intravenous Once Shakiara Lukic V, MD        Allergies as of 11/19/2023   (No Known Allergies)    Family History  Problem Relation Age of Onset   Asthma Child    Asthma Child    Colon cancer Neg Hx  Esophageal cancer Neg Hx    Rectal cancer Neg Hx    Stomach cancer Neg Hx     Social History   Socioeconomic History   Marital status: Single    Spouse name: Not on file   Number of children: Not on file   Years of education: Not on file   Highest education level: Not on file  Occupational History   Not on file  Tobacco Use   Smoking status: Never   Smokeless tobacco: Never  Vaping Use   Vaping status: Never Used  Substance and Sexual Activity   Alcohol use: Yes    Alcohol/week: 2.0 standard drinks of alcohol    Types: 2 Glasses of wine per week   Drug use: No   Sexual activity: Yes    Birth control/protection: Surgical  Other Topics Concern   Not on file  Social History Narrative   Not on file   Social Drivers  of Health   Financial Resource Strain: Not on file  Food Insecurity: Not on file  Transportation Needs: Not on file  Physical Activity: Not on file  Stress: Not on file  Social Connections: Unknown (11/15/2021)   Received from [facility], [facility]   Social Network    Social Network: Not on file  Intimate Partner Violence: Unknown (10/06/2021)   Received from [facility], [facility]   HITS    Physically Hurt: Not on file    Insult or Talk Down To: Not on file    Threaten Physical Harm: Not on file    Scream or Curse: Not on file    Review of Systems:  All other review of systems negative except as mentioned in the HPI.  Physical Exam: Vital signs in last 24 hours: BP 122/84   Pulse 76   Temp 97.9 F (36.6 C) (Skin)   Ht 5\' 5"  (1.651 m)   Wt 159 lb (72.1 kg)   SpO2 98%   BMI 26.46 kg/m  General:   Alert, NAD Lungs:  Clear .   Heart:  Regular rate and rhythm Abdomen:  Soft, nontender and nondistended. Neuro/Psych:  Alert and cooperative. Normal mood and affect. A and O x 3  Reviewed labs, radiology imaging, old records and pertinent past GI work up  Patient is appropriate for planned procedure(s) and anesthesia in an ambulatory setting   K. Veena Feliz Herard , MD [phone]

## 2023-11-20 ENCOUNTER — Telehealth: Payer: Self-pay

## 2023-11-20 NOTE — Telephone Encounter (Signed)
  Follow up Call-     11/19/2023    9:31 AM  Call back number  Post procedure Call Back phone  # [phone]  Permission to leave phone message Yes     Patient questions:  Do you have a fever, pain , or abdominal swelling? No. Pain Score  0 *  Have you tolerated food without any problems? Yes.    Have you been able to return to your normal activities? Yes.    Do you have any questions about your discharge instructions: Diet   No. Medications  No. Follow up visit  No.  Do you have questions or concerns about your Care? No.  Actions: * If pain score is 4 or above: No action needed, pain <4.

## 2023-12-13 DIAGNOSIS — Z419 Encounter for procedure for purposes other than remedying health state, unspecified: Secondary | ICD-10-CM | POA: Diagnosis not present

## 2024-01-12 DIAGNOSIS — Z419 Encounter for procedure for purposes other than remedying health state, unspecified: Secondary | ICD-10-CM | POA: Diagnosis not present

## 2024-02-12 DIAGNOSIS — Z419 Encounter for procedure for purposes other than remedying health state, unspecified: Secondary | ICD-10-CM | POA: Diagnosis not present

## 2024-02-20 ENCOUNTER — Ambulatory Visit: Admitting: Physician Assistant

## 2024-03-14 DIAGNOSIS — Z419 Encounter for procedure for purposes other than remedying health state, unspecified: Secondary | ICD-10-CM | POA: Diagnosis not present

## 2024-04-08 ENCOUNTER — Encounter: Payer: Self-pay | Admitting: Physician Assistant

## 2024-04-08 ENCOUNTER — Ambulatory Visit: Admitting: Physician Assistant

## 2024-04-08 VITALS — BP 84/60 | HR 72 | Ht 64.5 in | Wt 158.0 lb

## 2024-04-08 DIAGNOSIS — K208 Other esophagitis without bleeding: Secondary | ICD-10-CM

## 2024-04-08 DIAGNOSIS — K2100 Gastro-esophageal reflux disease with esophagitis, without bleeding: Secondary | ICD-10-CM | POA: Diagnosis not present

## 2024-04-08 MED ORDER — ESOMEPRAZOLE MAGNESIUM 40 MG PO CPDR: 40.0000 mg | DELAYED_RELEASE_CAPSULE | Freq: Two times a day (BID) | ORAL | 3 refills | Status: AC

## 2024-04-08 NOTE — Patient Instructions (Signed)
 We have sent the following medications to your pharmacy for you to pick up at your convenience: Nexium  40 mg  twice daily 30-60 minutes before breakfast and dinner.   Follow up in 1 year or sooner if needed.   _______________________________________________________  If your blood pressure at your visit was 140/90 or greater, please contact your primary care physician to follow up on this.  _______________________________________________________  If you are age 37 or older, your body mass index should be between 23-30. Your Body mass index is 26.7 kg/m. If this is out of the aforementioned range listed, please consider follow up with your Primary Care Provider.  If you are age 37 or younger, your body mass index should be between 19-25. Your Body mass index is 26.7 kg/m. If this is out of the aformentioned range listed, please consider follow up with your Primary Care Provider.   ________________________________________________________  The [facility] providers would like to encourage you to use MYCHART to communicate with providers for non-urgent requests or questions.  Due to long hold times on the telephone, sending your provider a message by [facility] may be a faster and more efficient way to get a response.  Please allow 48 business hours for a response.  Please remember that this is for non-urgent requests.  _______________________________________________________  [facility] is using a team-based approach to care.  Your team is made up of your doctor and two to three APPS. Our APPS (Nurse Practitioners and Physician Assistants) work with your physician to ensure care continuity for you. They are fully qualified to address your health concerns and develop a treatment plan. They communicate directly with your gastroenterologist to care for you. Seeing the Advanced Practice Practitioners on your physician's team can help you by facilitating care more promptly, often allowing for  earlier appointments, access to diagnostic testing, procedures, and other specialty referrals.

## 2024-04-08 NOTE — Progress Notes (Signed)
 Chief Complaint: Follow-up EGD and GERD  HPI:    Shannon Tucker is a 38 year old female, known to Dr. Shila, with a past medical history as listed below, who returns to clinic today for follow-up after recent EGD and for GERD.      05/29/2023 patient seen via telemedicine for vomiting abdominal pain.  Recently had breast surgery 2 weeks prior.  At that time Omeprazole  40 mg daily was helping.  Famotidine  20 mg was added.    05/30/2023 iron  studies with an iron  low at 20 and a percent saturation low at 4.7.  Patient started on iron  supplement.    06/04/2023 fecal occult blood negative.    06/15/2023 CBC normal.    08/21/2023 seen in clinic by me and at that time discussed that back in November she was having issues with nausea and vomiting epigastric pain, better after she was started on Omeprazole  40 mg daily and Famotidine  20 mg twice daily.  No further vomiting.  She had also recently been started on iron  tabs.  At that time increase Omeprazole  to 40 twice daily and continued Famotidine  20 twice daily.  We rechecked iron  studies.  She reported very heavy periods which were likely the cause.    08/21/23 iron  studies normal.    11/09/2023 patient seen in clinic and described her heartburn epigastric discomfort over 80% better with Famotidine  20 mg every day and Omeprazole  40 mg BID.  At that time scheduled her for diagnostic EGD for further eval and continued on Omeprazole  40 twice daily and Famotidine  20 nightly.  Discussed possible SIBO testing in the future pending results from EGD.    11/19/2023 EGD with LA grade B reflux esophagitis and a 3 cm hiatal hernia.  Patient told to use Nexium  40 mg p.o. twice daily and stop Omeprazole .    Today, the patient tells that she is doing very well on her Nexium  40 p.o. twice daily.  If she forgets this medicine then she may have some breakthrough symptoms but as long as she takes that she does just fine.  She has been trying to avoid greasy fried food as this as  well as coffee can irritate her.    Denies fever, chills or weight loss.  Past Medical History:  Diagnosis Date   Allergy     Anemia    Asthma    as a child, no prob as adult, no inhalers   Ovarian cyst     Past Surgical History:  Procedure Laterality Date   CESAREAN SECTION  2013, 2016   x 2   LAPAROSCOPIC OVARIAN CYSTECTOMY Right 01/29/2017   Procedure: LAPAROSCOPIC REMOVAL RIGHT DERMOID CYST;  Surgeon: Starla Harland BROCKS, MD;  Location: [facility];  Service: Gynecology;  Laterality: Right;   SHOULDER SURGERY Right    TUBAL LIGATION      Current Outpatient Medications  Medication Sig Dispense Refill   cetirizine  (ZYRTEC  ALLERGY ) 10 MG tablet Take 1 tablet (10 mg total) by mouth daily. 30 tablet 3   esomeprazole  (NEXIUM ) 40 MG capsule Take 1 capsule (40 mg total) by mouth 2 (two) times daily before a meal. 180 capsule 3   famotidine  (PEPCID ) 20 MG tablet TAKE 1 TABLET BY MOUTH EVERY DAY 90 tablet 1   guaiFENesin  (MUCINEX ) 600 MG 12 hr tablet Take 2 tablets (1,200 mg total) by mouth 2 (two) times daily. 30 tablet 0   Iron , Ferrous Sulfate , 325 (65 Fe) MG TABS 1 tab po bid 60 tablet 2   omeprazole  (  PRILOSEC) 40 MG capsule Take 1 capsule (40 mg total) by mouth in the morning and at bedtime. 60 capsule 5   tranexamic acid  (LYSTEDA ) 650 MG TABS tablet Take 2 tablets (1,300 mg total) by mouth 3 (three) times daily. Take during menses for a maximum of five days 30 tablet 2   No current facility-administered medications for this visit.    Allergies as of 04/08/2024   (No Known Allergies)    Family History  Problem Relation Age of Onset   Asthma Child    Asthma Child    Colon cancer Neg Hx    Esophageal cancer Neg Hx    Rectal cancer Neg Hx    Stomach cancer Neg Hx     Social History   Socioeconomic History   Marital status: Single    Spouse name: Not on file   Number of children: Not on file   Years of education: Not on file   Highest education level: Not on file  Occupational  History   Not on file  Tobacco Use   Smoking status: Never   Smokeless tobacco: Never  Vaping Use   Vaping status: Never Used  Substance and Sexual Activity   Alcohol use: Yes    Alcohol/week: 2.0 standard drinks of alcohol    Types: 2 Glasses of wine per week   Drug use: No   Sexual activity: Yes    Birth control/protection: Surgical  Other Topics Concern   Not on file  Social History Narrative   Not on file   Social Drivers of Health   Financial Resource Strain: Not on file  Food Insecurity: Not on file  Transportation Needs: Not on file  Physical Activity: Not on file  Stress: Not on file  Social Connections: Unknown (11/15/2021)   Received from [facility]   Social Network    Social Network: Not on file  Intimate Partner Violence: Unknown (10/06/2021)   Received from [facility]   HITS    Physically Hurt: Not on file    Insult or Talk Down To: Not on file    Threaten Physical Harm: Not on file    Scream or Curse: Not on file    Review of Systems:    Constitutional: No weight loss, fever or chills Cardiovascular: No chest pain Respiratory: No SOB  Gastrointestinal: See HPI and otherwise negative   Physical Exam:  Vital signs: BP (!) 84/60 (BP Location: Left Arm, Patient Position: Sitting, Cuff Size: Normal)   Pulse 72   Ht 5' 4.5 (1.638 m) Comment: height measured without shoes  Wt 158 lb (71.7 kg)   LMP 03/12/2024   BMI 26.70 kg/m    Constitutional:   Pleasant Hispanic female appears to be in NAD, Well developed, Well nourished, alert and cooperative Respiratory: Respirations even and unlabored. Lungs clear to auscultation bilaterally.   No wheezes, crackles, or rhonchi.  Cardiovascular: Normal S1, S2. No MRG. Regular rate and rhythm. No peripheral edema, cyanosis or pallor.  Gastrointestinal:  Soft, nondistended, nontender. No rebound or guarding. Normal bowel sounds. No appreciable masses or hepatomegaly. Rectal:  Not performed.  Psychiatric:  Demonstrates good judgement and reason without abnormal affect or behaviors.  RELEVANT LABS AND IMAGING: CBC    Component Value Date/Time   WBC 6.6 06/15/2023 0857   RBC 4.25 06/15/2023 0857   HGB 11.3 (L) 06/15/2023 0857   HGB 12.5 06/09/2021 1404   HCT 35.9 (L) 06/15/2023 0857   HCT 37.3 06/09/2021 1404   PLT  246.0 06/15/2023 0857   PLT 226 06/09/2021 1404   MCV 84.4 06/15/2023 0857   MCV 89 06/09/2021 1404   MCH 29.8 06/09/2021 1404   MCH 29.2 01/25/2017 1507   MCHC 31.5 06/15/2023 0857   RDW 15.4 06/15/2023 0857   RDW 13.0 06/09/2021 1404   LYMPHSABS 2.1 06/15/2023 0857   MONOABS 0.5 06/15/2023 0857   EOSABS 0.2 06/15/2023 0857   BASOSABS 0.0 06/15/2023 0857    CMP     Component Value Date/Time   NA 137 05/29/2023 1423   K 4.0 05/29/2023 1423   CL 104 05/29/2023 1423   CO2 26 05/29/2023 1423   GLUCOSE 113 (H) 05/29/2023 1423   BUN 10 05/29/2023 1423   CREATININE 0.60 05/29/2023 1423   CALCIUM 9.0 05/29/2023 1423   PROT 6.8 05/29/2023 1423   ALBUMIN 4.1 05/29/2023 1423   AST 20 05/29/2023 1423   ALT 45 (H) 05/29/2023 1423   ALKPHOS 51 05/29/2023 1423   BILITOT 0.4 05/29/2023 1423   GFRNONAA >60 12/17/2016 0355   GFRAA >60 12/17/2016 0355    Assessment: 1.  GERD with esophagitis: EGD in May with grade B esophagitis and change from Omeprazole  to Nexium  40 twice daily, patient doing well as long as she stays on this medication  Plan: 1.  Refill Nexium  40 mg twice daily #60 with 11 refills 2.  Reviewed antireflux diet and lifestyle modifications. 3.  Patient to follow in clinic in a year.  At that time we will discuss titrating back to once daily dosing  Shannon Failing, PA-C [facility] 04/08/2024, 8:28 AM  Cc: Saguier, Edward, PA-C
